# Patient Record
Sex: Female | Born: 1990 | Race: Asian | Hispanic: No | Smoking: Former smoker
Health system: Southern US, Community
[De-identification: ages and names within clinical notes are randomized; demographics above are authoritative.]

## PROBLEM LIST (undated history)

## (undated) ENCOUNTER — Inpatient Hospital Stay (HOSPITAL_COMMUNITY): Payer: Self-pay

## (undated) DIAGNOSIS — R002 Palpitations: Secondary | ICD-10-CM

## (undated) DIAGNOSIS — I10 Essential (primary) hypertension: Secondary | ICD-10-CM

## (undated) HISTORY — PX: NO PAST SURGERIES: SHX2092

## (undated) HISTORY — DX: Palpitations: R00.2

---

## 2012-08-24 ENCOUNTER — Inpatient Hospital Stay (HOSPITAL_COMMUNITY): Payer: Medicaid Other

## 2012-08-24 ENCOUNTER — Inpatient Hospital Stay (HOSPITAL_COMMUNITY)
Admission: AD | Admit: 2012-08-24 | Discharge: 2012-08-24 | Disposition: A | Discharge status: Home / Self Care | Payer: Medicaid Other | Source: Ambulatory Visit | Attending: Obstetrics & Gynecology | Admitting: Obstetrics & Gynecology

## 2012-08-24 ENCOUNTER — Encounter (HOSPITAL_COMMUNITY): Payer: Self-pay | Admitting: Family

## 2012-08-24 DIAGNOSIS — O418X90 Other specified disorders of amniotic fluid and membranes, unspecified trimester, not applicable or unspecified: Secondary | ICD-10-CM

## 2012-08-24 DIAGNOSIS — O10019 Pre-existing essential hypertension complicating pregnancy, unspecified trimester: Secondary | ICD-10-CM | POA: Insufficient documentation

## 2012-08-24 DIAGNOSIS — O209 Hemorrhage in early pregnancy, unspecified: Secondary | ICD-10-CM | POA: Insufficient documentation

## 2012-08-24 HISTORY — DX: Essential (primary) hypertension: I10

## 2012-08-24 LAB — WET PREP, GENITAL: Yeast Wet Prep HPF POC: NONE SEEN

## 2012-08-24 LAB — URINALYSIS, ROUTINE W REFLEX MICROSCOPIC
Bilirubin Urine: NEGATIVE
Glucose, UA: NEGATIVE mg/dL
Protein, ur: NEGATIVE mg/dL
Urobilinogen, UA: 0.2 mg/dL (ref 0.0–1.0)

## 2012-08-24 LAB — URINE MICROSCOPIC-ADD ON

## 2012-08-24 NOTE — MAU Provider Note (Signed)
History     CSN: [id number]  Arrival date and time: 08/24/12 7829   First Provider Initiated Contact with Patient 08/24/12 2050      Chief Complaint  Patient presents with  . Vaginal Bleeding   HPI Veronica Griffin 21 y.o. [redacted]w[redacted]d Comes to MAU today with vaginal bleeding.  Has not started prenatal care.  Has HTN and was started on medication by Urgent Care recently.  Is not consistent with taking medication.  OB History    Grav Para Term Preterm Abortions TAB SAB Ect Mult Living   2    1           Past Medical History  Diagnosis Date  . Hypertension     History reviewed. No pertinent past surgical history.  History reviewed. No pertinent family history.  History  Substance Use Topics  . Smoking status: Never Smoker   . Smokeless tobacco: Not on file  . Alcohol Use: No    Allergies: No Known Allergies  Prescriptions prior to admission  Medication Sig Dispense Refill  . methyldopa (ALDOMET) 250 MG tablet Take 250 mg by mouth 3 (three) times daily.      . Prenatal Vit-Fe Fumarate-FA (PRENATAL MULTIVITAMIN) TABS Take 1 tablet by mouth daily.        ROS Physical Exam   Blood pressure 132/93, pulse 81, temperature 97.8 F (36.6 C), temperature source Oral, resp. rate 16, height 5\' 6"  (1.676 m), weight 68.765 kg (151 lb 9.6 oz), last menstrual period 06/05/2012.  Physical Exam  Nursing note and vitals reviewed. Constitutional: She is oriented to person, place, and time. She appears well-developed and well-nourished.  HENT:  Head: Normocephalic.  Eyes: EOM are normal.  Neck: Neck supple.  GI: Soft. There is no tenderness.  Genitourinary:       Speculum exam: Vagina - Mod amount of blood seen, no odor Cervix - No contact bleeding Bimanual exam: Cervix closed Uterus non tender, 8 week size Adnexa non tender, no masses bilaterally GC/Chlam, wet prep done Chaperone present for exam.  Musculoskeletal: Normal range of motion.  Neurological: She is alert and  oriented to person, place, and time.  Skin: Skin is warm and dry.  Psychiatric: She has a normal mood and affect.    MAU Course  Procedures Results for orders placed during the hospital encounter of 08/24/12 (from the past 24 hour(s))  URINALYSIS, ROUTINE W REFLEX MICROSCOPIC     Status: Abnormal   Collection Time   08/24/12  8:12 PM      Component Value Range   Color, Urine YELLOW  YELLOW   APPearance CLEAR  CLEAR   Specific Gravity, Urine 1.020  1.005 - 1.030   pH 7.0  5.0 - 8.0   Glucose, UA NEGATIVE  NEGATIVE mg/dL   Hgb urine dipstick MODERATE (*) NEGATIVE   Bilirubin Urine NEGATIVE  NEGATIVE   Ketones, ur NEGATIVE  NEGATIVE mg/dL   Protein, ur NEGATIVE  NEGATIVE mg/dL   Urobilinogen, UA 0.2  0.0 - 1.0 mg/dL   Nitrite NEGATIVE  NEGATIVE   Leukocytes, UA NEGATIVE  NEGATIVE  URINE MICROSCOPIC-ADD ON     Status: Normal   Collection Time   08/24/12  8:12 PM      Component Value Range   Squamous Epithelial / LPF RARE  RARE   WBC, UA 0-2  <3 WBC/hpf   RBC / HPF 0-2  <3 RBC/hpf   Urine-Other MUCOUS PRESENT    POCT PREGNANCY, URINE  Status: Abnormal   Collection Time   08/24/12  8:21 PM      Component Value Range   Preg Test, Ur POSITIVE (*) NEGATIVE  WET PREP, GENITAL     Status: Abnormal   Collection Time   08/24/12  9:16 PM      Component Value Range   Yeast Wet Prep HPF POC NONE SEEN  NONE SEEN   Trich, Wet Prep NONE SEEN  NONE SEEN   Clue Cells Wet Prep HPF POC MODERATE (*) NONE SEEN   WBC, Wet Prep HPF POC FEW (*) NONE SEEN    MDM Clinical Data: Unsure of LMP. Bleeding.  OBSTETRIC <14 WK [place] AND TRANSVAGINAL OB US  Technique: Both transabdominal and transvaginal ultrasound  examinations were performed for complete evaluation of the  gestation as well as the maternal uterus, adnexal regions, and  pelvic cul-de-sac. Transvaginal technique was performed to assess  early pregnancy.  Comparison: None.  Intrauterine gestational sac: A single intrauterine  gestational  sac is present. A large subchorionic hemorrhage is visualized  Yolk sac: Yolk sac is present.  Embryo: Fetal pole is demonstrated.  Cardiac Activity: Fetal cardiac activity is visualized.  Heart Rate: 131 bpm  CRL: 10 mm 7 w 1 d [place] EDC: 04/11/2013  Maternal uterus/adnexae:  Images of the uterus suggesting arcuate configuration. The left  ovary measures 3.2 x 2.1 x 2.2 cm. No abnormal adnexal masses.  The right ovary measures 2.7 x 3.7 x 3.2 cm and contains a complex  cystic structure, likely a corpus luteum cyst. Normal follicular  changes. No abnormal adnexal masses. No free pelvic fluid  collections.  IMPRESSION:  Single intrauterine pregnancy. Estimated gestational age by crown-  rump length is 7 weeks 1 day. Large subchorionic hemorrhage is  visualized. Uterus suggesting arcuate configuration.   Blood type B positive   Assessment and Plan  7w 1d pregnancy with large subchorionic hemorrhage  Plan Pelvic rest until you have been seen in prenatal care - no sex, no tampons, no douching Continue blood pressure medication Begin prenatal care as soon as possible Take Tylenol 325 mg 2 tablets by mouth every 4 hours if needed for pain. Drink at least 8 8-oz glasses of water every day.    BURLESON,TERRI 08/24/2012, 11:34 PM

## 2012-08-24 NOTE — MAU Note (Signed)
Pt reports at 1930 felt feeling of starting period and went to BR; had blood in underwear and passed clot. Denies cramping.

## 2012-08-24 NOTE — MAU Note (Signed)
Pt LMP 06/05/2012, +UPT at [facility].   Bleeding today like a period, now very light.  Denies pain.

## 2012-08-25 ENCOUNTER — Emergency Department (HOSPITAL_BASED_OUTPATIENT_CLINIC_OR_DEPARTMENT_OTHER)
Admission: EM | Admit: 2012-08-25 | Discharge: 2012-08-25 | Disposition: A | Discharge status: Home / Self Care | Payer: Medicaid Other

## 2012-08-26 LAB — GC/CHLAMYDIA PROBE AMP, GENITAL: Chlamydia, DNA Probe: NEGATIVE

## 2012-09-16 ENCOUNTER — Ambulatory Visit (INDEPENDENT_AMBULATORY_CARE_PROVIDER_SITE_OTHER): Payer: Medicaid Other | Admitting: Obstetrics and Gynecology

## 2012-09-16 ENCOUNTER — Other Ambulatory Visit: Payer: Self-pay | Admitting: Obstetrics and Gynecology

## 2012-09-16 ENCOUNTER — Other Ambulatory Visit: Payer: Self-pay

## 2012-09-16 ENCOUNTER — Encounter: Payer: Self-pay | Admitting: Obstetrics and Gynecology

## 2012-09-16 ENCOUNTER — Encounter (INDEPENDENT_AMBULATORY_CARE_PROVIDER_SITE_OTHER): Payer: Medicaid Other

## 2012-09-16 VITALS — BP 124/80 | Ht 66.0 in | Wt 147.0 lb

## 2012-09-16 DIAGNOSIS — Z331 Pregnant state, incidental: Secondary | ICD-10-CM

## 2012-09-16 DIAGNOSIS — O200 Threatened abortion: Secondary | ICD-10-CM

## 2012-09-16 DIAGNOSIS — O26849 Uterine size-date discrepancy, unspecified trimester: Secondary | ICD-10-CM

## 2012-09-16 NOTE — Progress Notes (Unsigned)
Pt presented for NOB interview. Had U/s 07/24/12 for bleeding which showed lg subchoroinic hemorrhage.  No bleeding or pain until  09/15/12 when started bleeding enough to use a panty liner and cramping.  NO recent IC.  Per Dr AVS,interview cancelled.  Scheduled for U/S and F/U today.  Pt agreeable. Pt also has hx hypertension and is taking Aldomet 250 mg tid.

## 2012-09-16 NOTE — Progress Notes (Signed)
HISTORY OF PRESENT ILLNESS  Ms. Veronica Griffin is a 21 y.o. year old female,G2P0010, who presents for a problem visit. The patient had an ultrasound and the [facility] on August 24, 2012 because of vaginal bleeding.  The ultrasound showed a viable gestation at 7 weeks and 1 day.  A subchorionic hemorrhage was noted.  Subjective:  The patient has pain and has had bleeding in the past.  Objective:  BP 124/80  Ht 5\' 6"  (1.676 m)  Wt 147 lb (66.679 kg)  BMI 23.73 kg/m2  LMP 06/05/2012   General: no distress GI: soft and nontender  Exam deferred.  Ultrasound: Single gestation at 10 weeks and 3 days consistent with prior ultrasound.  Fetal heart rate 159 bpm.  Subchorionic hemorrhage noted.  Assessment:  Threatened abortion  Plan:  Return for new OB exam and interview.  Patient's blood type is B+.  Return to office prn if symptoms worsen or fail to improve.   Leonard Schwartz M.D.  09/16/2012 3:13 PM

## 2012-09-23 ENCOUNTER — Ambulatory Visit (INDEPENDENT_AMBULATORY_CARE_PROVIDER_SITE_OTHER): Payer: Medicaid Other | Admitting: Obstetrics and Gynecology

## 2012-09-23 DIAGNOSIS — Z331 Pregnant state, incidental: Secondary | ICD-10-CM

## 2012-09-23 LAB — US OB COMP LESS 14 WKS

## 2012-09-24 LAB — PRENATAL PANEL VII
Eosinophils Absolute: 0 10*3/uL (ref 0.0–0.7)
Hemoglobin: 12 g/dL (ref 12.0–15.0)
Hepatitis B Surface Ag: NEGATIVE
Lymphocytes Relative: 25 % (ref 12–46)
Lymphs Abs: 1.7 10*3/uL (ref 0.7–4.0)
MCH: 25.1 pg — ABNORMAL LOW (ref 26.0–34.0)
MCV: 73.8 fL — ABNORMAL LOW (ref 78.0–100.0)
Monocytes Relative: 6 % (ref 3–12)
Neutrophils Relative %: 68 % (ref 43–77)
Platelets: 326 10*3/uL (ref 150–400)
RBC: 4.78 MIL/uL (ref 3.87–5.11)
Rubella: 1.04 Index — ABNORMAL HIGH (ref <0.90)
WBC: 6.8 10*3/uL (ref 4.0–10.5)

## 2012-09-24 LAB — POCT URINALYSIS DIPSTICK
Leukocytes, UA: NEGATIVE
Nitrite, UA: NEGATIVE
Protein, UA: NEGATIVE
Urobilinogen, UA: NEGATIVE
pH, UA: 6

## 2012-09-24 LAB — CULTURE, OB URINE
Colony Count: NO GROWTH
Organism ID, Bacteria: NO GROWTH

## 2012-09-24 NOTE — Progress Notes (Signed)
NOB interview completed.  Pt's last interview was stopped d/t vaginal bldg, was followed up w/ [place] and visit at that time.  Pt denies any bldg since that time.  NOB work up scheduled on Thursday 10/03/2012 w/ AVS.

## 2012-09-26 LAB — HEMOGLOBINOPATHY EVALUATION
Hemoglobin Other: 23.4 % — ABNORMAL HIGH
Hgb A: 73.4 % — ABNORMAL LOW (ref 96.8–97.8)
Hgb S Quant: 0 %

## 2012-10-01 LAB — HGB ELECTROPHORESIS REFLEXED REPORT
Hemoglobin A - HGBRFX: 72.9 % — ABNORMAL LOW (ref >96.0)
Hemoglobin E: 23.8 % — ABNORMAL HIGH

## 2012-10-02 NOTE — L&D Delivery Note (Signed)
  [place], [place] [[id number]]  Delivery Note At  a viable female, "Skylar",  was delivered via  (Presentation ROA: ;  ).  APGAR: , ; weight 4+6 Placenta status: Spontaneous and intact, .  Cord: WNL .  Cord pH: NA Placenta to pathology due to severe pre-eclampsia. Patient progressed rapidly to delivery.  SROM--clear fluid, just prior to delivery.  NICU team called to assess infant due to low birth weight.  Anesthesia:  Local Episiotomy: None Lacerations: 1st degree perineal Suture Repair: 3.0 vicryl Est. Blood Loss (mL): 300 cc  Mom to AICU for 24 hours of magnesium sulfate.  Apresoline IV prn elevated BP.  Continue Labetalol 300 mg po TID.  Baby to NICU.  Nigel Bridgeman 03/12/2013, 3:41 AM

## 2012-10-03 ENCOUNTER — Encounter: Payer: Medicaid Other | Admitting: Obstetrics and Gynecology

## 2012-10-08 DIAGNOSIS — O4690 Antepartum hemorrhage, unspecified, unspecified trimester: Secondary | ICD-10-CM

## 2012-10-08 HISTORY — DX: Antepartum hemorrhage, unspecified, unspecified trimester: O46.90

## 2012-10-10 ENCOUNTER — Ambulatory Visit (INDEPENDENT_AMBULATORY_CARE_PROVIDER_SITE_OTHER): Payer: Medicaid Other | Admitting: Obstetrics and Gynecology

## 2012-10-10 VITALS — BP 120/80 | Wt 147.0 lb

## 2012-10-10 DIAGNOSIS — B9689 Other specified bacterial agents as the cause of diseases classified elsewhere: Secondary | ICD-10-CM

## 2012-10-10 DIAGNOSIS — O10919 Unspecified pre-existing hypertension complicating pregnancy, unspecified trimester: Secondary | ICD-10-CM

## 2012-10-10 DIAGNOSIS — N760 Acute vaginitis: Secondary | ICD-10-CM

## 2012-10-10 DIAGNOSIS — Z3689 Encounter for other specified antenatal screening: Secondary | ICD-10-CM

## 2012-10-10 DIAGNOSIS — O10019 Pre-existing essential hypertension complicating pregnancy, unspecified trimester: Secondary | ICD-10-CM

## 2012-10-10 DIAGNOSIS — A499 Bacterial infection, unspecified: Secondary | ICD-10-CM

## 2012-10-10 DIAGNOSIS — O4690 Antepartum hemorrhage, unspecified, unspecified trimester: Secondary | ICD-10-CM

## 2012-10-10 HISTORY — DX: Unspecified pre-existing hypertension complicating pregnancy, unspecified trimester: O10.919

## 2012-10-10 MED ORDER — SIMILAC PRENATAL EARLY SHIELD 27-0.8 & 200 MG PO MISC: 1.0000 | Freq: Every day | ORAL | Status: DC

## 2012-10-10 MED ORDER — METRONIDAZOLE 500 MG PO TABS: 500.0000 mg | ORAL_TABLET | Freq: Two times a day (BID) | ORAL | Status: AC

## 2012-10-10 NOTE — Progress Notes (Signed)
Pt is here today for her NOB work-up. Pt had cultures done 08/24/2012 wnl, never had a pap. Pt stated no issues today.

## 2012-10-10 NOTE — Progress Notes (Signed)
Veronica Griffin is being seen today for her first obstetrical visit at [redacted]w[redacted]d gestation by 1st trim [place].  Seen MAU for vaginal bleeding, large SCH seen, no further bleeding seen since.  She reports upper back pain likely due to changes in posture.  Her obstetrical history is significant for: Patient Active Problem List  Diagnosis  . Vaginal bleeding in pregnancy  . Chronic hypertension in pregnancy  HTN: Diagnosed in Highschool, given Aldomet at Urgent care visit when pregnancy was diagnosed. Currently on 250mg  TID.  Relationship with [name]:  Veronica Griffin present with her today  She is employed  Feeding plan:   Breast  Pregnancy history fully reviewed.  The following portions of the patient's history were reviewed and updated as appropriate: allergies, current medications, past family history, past medical history, past social history, past surgical history and problem list.  Review of Systems Pertinent ROS is described in HPI   Objective:   BP 120/80  Wt 147 lb (66.679 kg)  LMP 06/05/2012 Wt Readings from Last 1 Encounters:  10/10/12 147 lb (66.679 kg)   BMI: There is no height on file to calculate BMI.  General: alert, cooperative and no distress HEENT: grossly normal  Thyroid: normal  Respiratory: clear to auscultation bilaterally Cardiovascular: regular rate and rhythm,  Breasts:  No dominant masses, nipples erect Gastrointestinal: soft, non-tender; no masses,  no organomegaly Extremities: extremities normal, no pain or edema Vaginal Bleeding: None Back: no CVAT EXTERNAL GENITALIA: normal appearing vulva with no masses, tenderness or lesions VAGINA: Mod amount frothy white dc  CERVIX: no lesions or cervical motion tenderness; cervix closed, long, firm UTERUS: gravid and consistent with 13-14 weeks ADNEXA: no masses palpable and nontender OB EXAM PELVIMETRY: appears adequate  FHR:  140  bpm  Assessment:    Pregnancy at [redacted]w[redacted]d  BV Chronic HTN--on  Aldomet  Plan:     Prenatal panel reviewed and discussed with the patient:  yes  Pap smear collected:  yes GC/Chlamydia collected:  yes Wet prep:  +BV Discussion of Genetic testing options: plans Quad screen at [facility] Prenatal vitamins recommended  Rx MTZ 500 mg BID x7 days Similac PNV  Plan of care: Next visit:  4 weeks for ROB, [place], 24h Urine, PIH labs, Quad  Other anticipated f/u:      Veronica Griffin, CNM Veronica Griffin CNM

## 2012-10-12 LAB — PAP IG, CT-NG, RFX HPV ASCU: Chlamydia Probe Amp: NEGATIVE

## 2012-10-14 ENCOUNTER — Encounter: Payer: Medicaid Other | Admitting: Obstetrics and Gynecology

## 2012-10-15 ENCOUNTER — Telehealth: Payer: Self-pay | Admitting: Obstetrics and Gynecology

## 2012-10-15 ENCOUNTER — Other Ambulatory Visit: Payer: Self-pay

## 2012-10-15 DIAGNOSIS — O10919 Unspecified pre-existing hypertension complicating pregnancy, unspecified trimester: Secondary | ICD-10-CM

## 2012-10-15 NOTE — Telephone Encounter (Signed)
Tc to pt per telephone call. Pt with appt on 11/07/12 and states,"only able to do 24 urine on Saturdays due to work schedule". Informed pt may perform 24 urine on 11/02/12 and turn it in on 11/04/12(ok per [facility]). Pt will also have PIH labs drawn with 24 urine drop off. Apps sched 11/04/12 @ 9:00a with lab only. Pt voices understanding.

## 2012-11-04 ENCOUNTER — Other Ambulatory Visit: Payer: Medicaid Other

## 2012-11-04 DIAGNOSIS — O10919 Unspecified pre-existing hypertension complicating pregnancy, unspecified trimester: Secondary | ICD-10-CM

## 2012-11-04 LAB — COMPLETE METABOLIC PANEL WITH GFR
ALT: 16 U/L (ref 0–35)
CO2: 24 mEq/L (ref 19–32)
Calcium: 9.4 mg/dL (ref 8.4–10.5)
Chloride: 103 mEq/L (ref 96–112)
Creat: 0.64 mg/dL (ref 0.50–1.10)
GFR, Est African American: >89 mL/min
GFR, Est Non African American: >89 mL/min
Glucose, Bld: 85 mg/dL (ref 70–99)
Sodium: 137 mEq/L (ref 135–145)
Total Protein: 6.8 g/dL (ref 6.0–8.3)

## 2012-11-04 LAB — CBC
Hemoglobin: 11 g/dL — ABNORMAL LOW (ref 12.0–15.0)
MCV: 73.6 fL — ABNORMAL LOW (ref 78.0–100.0)
Platelets: 296 10*3/uL (ref 150–400)
RBC: 4.24 MIL/uL (ref 3.87–5.11)
WBC: 7.6 10*3/uL (ref 4.0–10.5)

## 2012-11-04 LAB — CREATININE, URINE, 24 HOUR: Creatinine, 24H Ur: 1391 mg/d (ref 700–1800)

## 2012-11-07 ENCOUNTER — Encounter: Payer: Medicaid Other | Admitting: Obstetrics and Gynecology

## 2012-11-07 ENCOUNTER — Encounter: Payer: Self-pay | Admitting: Obstetrics and Gynecology

## 2012-11-07 ENCOUNTER — Ambulatory Visit: Payer: Medicaid Other

## 2012-11-07 ENCOUNTER — Ambulatory Visit: Payer: Medicaid Other | Admitting: Obstetrics and Gynecology

## 2012-11-07 VITALS — BP 110/70 | Wt 150.0 lb

## 2012-11-07 DIAGNOSIS — Z3689 Encounter for other specified antenatal screening: Secondary | ICD-10-CM

## 2012-11-07 DIAGNOSIS — Z331 Pregnant state, incidental: Secondary | ICD-10-CM

## 2012-11-07 DIAGNOSIS — Z1389 Encounter for screening for other disorder: Secondary | ICD-10-CM

## 2012-11-07 NOTE — Progress Notes (Signed)
[redacted]w[redacted]d Pt has no complaints  Pt declines flu vaccine

## 2012-11-07 NOTE — Progress Notes (Signed)
Ultrasound shows:  SIUP  S=D     [place] EDD: 04/14/13           EFW: 7 oz 54%           AFI: n/a           Cervical length: 3.24 cm           Placenta localization: posterior           Fetal presentation: breech                    Anatomy survey is normal           Gender : female Comments: placenta edge to cx os = 4.6 Normal placental cord insertion Measurements are concordant with established GA/EDD No fetal abnormality seen Anatomy not seen: 4 ch heart, RVOT, LVOT, DA Suggest f/u in 2-3 weeks

## 2012-11-07 NOTE — Progress Notes (Signed)
[redacted]w[redacted]d CHTN on Aldomet 250 mg TID Ultrasound at [facility] for heart anatomy

## 2012-11-12 LAB — US OB COMP + 14 WK

## 2012-11-15 ENCOUNTER — Telehealth: Payer: Self-pay | Admitting: Obstetrics and Gynecology

## 2012-11-15 NOTE — Telephone Encounter (Signed)
The patient called our office. She C/O nasal congestion. Management reviewed including fluids, rest, Tylenol, and Sudafed.  Dr. Stefano Gaul

## 2012-12-03 ENCOUNTER — Ambulatory Visit: Payer: Medicaid Other

## 2012-12-03 ENCOUNTER — Ambulatory Visit: Payer: Medicaid Other | Admitting: Obstetrics and Gynecology

## 2012-12-03 ENCOUNTER — Encounter: Payer: Self-pay | Admitting: Obstetrics and Gynecology

## 2012-12-03 VITALS — BP 114/78 | Wt 151.0 lb

## 2012-12-03 DIAGNOSIS — Z331 Pregnant state, incidental: Secondary | ICD-10-CM

## 2012-12-03 DIAGNOSIS — Z3689 Encounter for other specified antenatal screening: Secondary | ICD-10-CM

## 2012-12-03 DIAGNOSIS — O10912 Unspecified pre-existing hypertension complicating pregnancy, second trimester: Secondary | ICD-10-CM

## 2012-12-03 DIAGNOSIS — O358XX0 Maternal care for other (suspected) fetal abnormality and damage, not applicable or unspecified: Secondary | ICD-10-CM

## 2012-12-03 NOTE — Progress Notes (Signed)
[redacted]w[redacted]d [place] f/u anatomy cx 3.44 cm SIUP vtx Normal fluid All anatomy seen and normal QUAD screen today CHTN BP well controlled on aldomet 24 hr urine and labs WNL RT 2 weeks

## 2012-12-03 NOTE — Progress Notes (Signed)
Pt c/o lower abdominal pressure 

## 2012-12-04 LAB — AFP, QUAD SCREEN
Age Alone: 1:1160 {titer}
Curr Gest Age: 21.4 wks.days
Down Syndrome Scr Risk Est: <1:38500 {titer}
INH: 96.2 pg/mL
MoM for INH: 0.44
MoM for hCG: 0.44
Osb Risk: <1:27300 {titer}
Tri 18 Scr Risk Est: NEGATIVE
Trisomy 18 (Edward) Syndrome Interp.: 1:9330 {titer}
uE3 Value: 1.8 ng/mL

## 2012-12-10 LAB — US OB FOLLOW UP

## 2012-12-20 ENCOUNTER — Ambulatory Visit: Payer: Medicaid Other | Admitting: Obstetrics and Gynecology

## 2012-12-20 ENCOUNTER — Encounter: Payer: Self-pay | Admitting: Obstetrics and Gynecology

## 2012-12-20 VITALS — BP 106/70 | Wt 155.0 lb

## 2012-12-20 DIAGNOSIS — O10912 Unspecified pre-existing hypertension complicating pregnancy, second trimester: Secondary | ICD-10-CM

## 2012-12-20 DIAGNOSIS — Z331 Pregnant state, incidental: Secondary | ICD-10-CM

## 2012-12-20 NOTE — Progress Notes (Signed)
[redacted]w[redacted]d GFM Aldomet 250 mg TID: pt advised to reduce to BID C/O frequent heart palpitations which triggers cough without  Dizziness or chest pain. Auscultation with PVC and pause. Will refer to cardiology

## 2012-12-20 NOTE — Progress Notes (Signed)
[redacted]w[redacted]d Pt has no complaints

## 2012-12-26 ENCOUNTER — Other Ambulatory Visit (HOSPITAL_COMMUNITY): Payer: Self-pay | Admitting: Internal Medicine

## 2012-12-26 DIAGNOSIS — R0600 Dyspnea, unspecified: Secondary | ICD-10-CM

## 2012-12-26 DIAGNOSIS — R002 Palpitations: Secondary | ICD-10-CM

## 2012-12-31 ENCOUNTER — Ambulatory Visit (HOSPITAL_COMMUNITY)
Admission: RE | Admit: 2012-12-31 | Discharge: 2012-12-31 | Disposition: A | Discharge status: Home / Self Care | Payer: Medicaid Other | Source: Ambulatory Visit | Attending: Internal Medicine | Admitting: Internal Medicine

## 2012-12-31 DIAGNOSIS — I079 Rheumatic tricuspid valve disease, unspecified: Secondary | ICD-10-CM | POA: Insufficient documentation

## 2012-12-31 DIAGNOSIS — O99419 Diseases of the circulatory system complicating pregnancy, unspecified trimester: Secondary | ICD-10-CM | POA: Insufficient documentation

## 2012-12-31 DIAGNOSIS — R0600 Dyspnea, unspecified: Secondary | ICD-10-CM

## 2012-12-31 DIAGNOSIS — O169 Unspecified maternal hypertension, unspecified trimester: Secondary | ICD-10-CM | POA: Insufficient documentation

## 2012-12-31 DIAGNOSIS — O99891 Other specified diseases and conditions complicating pregnancy: Secondary | ICD-10-CM | POA: Insufficient documentation

## 2012-12-31 DIAGNOSIS — R002 Palpitations: Secondary | ICD-10-CM

## 2012-12-31 DIAGNOSIS — I379 Nonrheumatic pulmonary valve disorder, unspecified: Secondary | ICD-10-CM | POA: Insufficient documentation

## 2012-12-31 DIAGNOSIS — I2510 Atherosclerotic heart disease of native coronary artery without angina pectoris: Secondary | ICD-10-CM | POA: Insufficient documentation

## 2012-12-31 NOTE — Progress Notes (Signed)
2D Echo Performed 12/31/2012    Clearence Ped, RCS

## 2013-01-13 ENCOUNTER — Encounter (HOSPITAL_COMMUNITY): Payer: Self-pay

## 2013-01-13 ENCOUNTER — Inpatient Hospital Stay (HOSPITAL_COMMUNITY)
Admission: AD | Admit: 2013-01-13 | Discharge: 2013-01-13 | Disposition: A | Discharge status: Home / Self Care | Payer: Medicaid Other | Source: Ambulatory Visit | Attending: Obstetrics and Gynecology | Admitting: Obstetrics and Gynecology

## 2013-01-13 DIAGNOSIS — O99891 Other specified diseases and conditions complicating pregnancy: Secondary | ICD-10-CM | POA: Insufficient documentation

## 2013-01-13 DIAGNOSIS — R109 Unspecified abdominal pain: Secondary | ICD-10-CM | POA: Insufficient documentation

## 2013-01-13 DIAGNOSIS — O10919 Unspecified pre-existing hypertension complicating pregnancy, unspecified trimester: Secondary | ICD-10-CM

## 2013-01-13 LAB — URINALYSIS, ROUTINE W REFLEX MICROSCOPIC
Bilirubin Urine: NEGATIVE
Glucose, UA: NEGATIVE mg/dL
Protein, ur: NEGATIVE mg/dL
Specific Gravity, Urine: 1.025 (ref 1.005–1.030)
Urobilinogen, UA: 0.2 mg/dL (ref 0.0–1.0)

## 2013-01-13 LAB — CBC WITH DIFFERENTIAL/PLATELET
Eosinophils Relative: 1 % (ref 0–5)
Lymphocytes Relative: 12 % (ref 12–46)
Monocytes Relative: 6 % (ref 3–12)
Neutrophils Relative %: 81 % — ABNORMAL HIGH (ref 43–77)
Platelets: 274 10*3/uL (ref 150–400)
RBC: 4.2 MIL/uL (ref 3.87–5.11)
WBC: 14.4 10*3/uL — ABNORMAL HIGH (ref 4.0–10.5)

## 2013-01-13 LAB — URINE MICROSCOPIC-ADD ON

## 2013-01-13 LAB — COMPREHENSIVE METABOLIC PANEL
ALT: 22 U/L (ref 0–35)
AST: 22 U/L (ref 0–37)
CO2: 20 mEq/L (ref 19–32)
Chloride: 103 mEq/L (ref 96–112)
GFR calc non Af Amer: 88 mL/min — ABNORMAL LOW (ref >90)
Sodium: 136 mEq/L (ref 135–145)
Total Bilirubin: 0.2 mg/dL — ABNORMAL LOW (ref 0.3–1.2)

## 2013-01-13 MED ORDER — DEXTROSE 5 % IV SOLN
2.0000 g | Freq: Once | INTRAVENOUS | Status: AC
  Administered 2013-01-13: 2 g via INTRAVENOUS
  Filled 2013-01-13: qty 2

## 2013-01-13 MED ORDER — PROMETHAZINE HCL 25 MG/ML IJ SOLN
25.0000 mg | Freq: Once | INTRAMUSCULAR | Status: AC
  Administered 2013-01-13: 25 mg via INTRAVENOUS
  Filled 2013-01-13: qty 1

## 2013-01-13 MED ORDER — BUTORPHANOL TARTRATE 1 MG/ML IJ SOLN
1.0000 mg | Freq: Once | INTRAMUSCULAR | Status: AC
  Administered 2013-01-13: 1 mg via INTRAVENOUS
  Filled 2013-01-13: qty 1

## 2013-01-13 MED ORDER — LACTATED RINGERS IV SOLN
INTRAVENOUS | Status: DC
  Administered 2013-01-13: 09:00:00 via INTRAVENOUS

## 2013-01-13 MED ORDER — IBUPROFEN 800 MG PO TABS
800.0000 mg | ORAL_TABLET | Freq: Once | ORAL | Status: AC
  Administered 2013-01-13: 800 mg via ORAL
  Filled 2013-01-13: qty 1

## 2013-01-13 MED ORDER — CEPHALEXIN 500 MG PO CAPS: 500.0000 mg | ORAL_CAPSULE | Freq: Four times a day (QID) | ORAL | Status: AC

## 2013-01-13 MED ORDER — IBUPROFEN 800 MG PO TABS: 800.0000 mg | ORAL_TABLET | Freq: Three times a day (TID) | ORAL | Status: DC | PRN

## 2013-01-13 MED ORDER — ACETAMINOPHEN-CODEINE #3 300-30 MG PO TABS: 1.0000 | ORAL_TABLET | ORAL | Status: DC | PRN

## 2013-01-13 MED ORDER — LACTATED RINGERS IV BOLUS (SEPSIS)
500.0000 mL | Freq: Once | INTRAVENOUS | Status: AC
  Administered 2013-01-13: 07:00:00 via INTRAVENOUS

## 2013-01-13 NOTE — MAU Provider Note (Signed)
History     CSN: [id number]  Arrival date and time: 01/13/13 0516   None     Chief Complaint  Patient presents with  . Abdominal Pain   HPI Comments: Pt is a G2P0 at [redacted]w[redacted]d w sudden onset of vague L sided/flank pain at about 9pm this evening, pt had called overnight and instructed to PO hydrate, take 600mg  motrin, hot bath/shower or heating pad, and return call if pain worsens or no relief, pt returned TC at about 430 this morning w report of no pain relief and instructed to come to MAU. She denies any ctx, VB, LOF, or discharge, reports GFM. C/o urinary urgency, in the last day, but no frequency, or dysuria. Denies any fever, aches, chills. Pain does not radiate.  Pt has a history of hypertension prior to pregnancy, has been on aldomet TID, recently had cardiac w/u. Otherwise has had an uncomplicated pregnancy, next appt this week.      Past Medical History  Diagnosis Date  . Hypertension     Taking Aldomet 250mg  TID    Past Surgical History  Procedure Laterality Date  . No past surgeries      Family History  Problem Relation Age of Onset  . Hypertension Mother   . Hypertension Maternal Aunt     History  Substance Use Topics  . Smoking status: Former Smoker    Types: Cigarettes    Quit date: 12/01/2011  . Smokeless tobacco: Never Used  . Alcohol Use: 1.0 oz/week    2 drink(s) per week     Comment: Occasionally before +UPT    Allergies: No Known Allergies  Prescriptions prior to admission  Medication Sig Dispense Refill  . methyldopa (ALDOMET) 250 MG tablet Take 250 mg by mouth 3 (three) times daily.      . Prenatal MV-Min-Fe Fum-FA-DHA [employer] PRENATAL EARLY SHIELD) 27-0.8 & 200 MG MISC Take 1 tablet by mouth daily.  30 each  11  . Prenatal Vit-Fe Fumarate-FA (PRENATAL MULTIVITAMIN) TABS Take 1 tablet by mouth daily.        Review of Systems  Constitutional: Negative for fever and chills.  Eyes: Negative for blurred vision.  Respiratory: Negative for  shortness of breath.   Cardiovascular: Negative for chest pain and leg swelling.  Gastrointestinal: Positive for nausea. Negative for heartburn, vomiting, abdominal pain, diarrhea and constipation.  Genitourinary: Positive for urgency and flank pain. Negative for dysuria, frequency and hematuria.  Musculoskeletal: Positive for back pain. Negative for myalgias.  Neurological: Negative for headaches.   Physical Exam   Blood pressure 132/94, pulse 98, temperature 98.2 F (36.8 C), temperature source Oral, resp. rate 16, height 5\' 6"  (1.676 m), weight 161 lb (73.029 kg), last menstrual period 06/05/2012, SpO2 100.00%.  Physical Exam  Nursing note and vitals reviewed. Constitutional: She is oriented to person, place, and time. She appears well-developed and well-nourished. She appears distressed.  Pt visibly uncomfortable, holds L side   HENT:  Head: Normocephalic.  Eyes: Pupils are equal, round, and reactive to light.  Neck: Normal range of motion.  Cardiovascular: Normal rate, regular rhythm and normal heart sounds.   Respiratory: Effort normal and breath sounds normal.  GI: Soft. Bowel sounds are normal. She exhibits no distension. There is no tenderness. There is no rebound and no guarding.  aga L CVAT   Genitourinary: Vagina normal.  Cervix FT/th/high, posterior soft  Musculoskeletal: Normal range of motion.  Neurological: She is alert and oriented to person, place, and time. She has  normal reflexes.  Skin: Skin is warm and dry.  Psychiatric: She has a normal mood and affect. Her behavior is normal.   NST reactive toco quiet  MAU Course  Procedures   Assessment and Plan  IUP at [redacted]w[redacted]d Suspect pyelonephritis or kidney stone  FHR reassuring  WBC elevated, diff pending cmet WNL UA +hgb  IVF's LR bolus Stadol/phenergan IV UA sent for cx Consider [place] to r/o kidney stone  Report to C.Steelman, CNM    LILLARD,SHELLEY M 01/13/2013, 6:49 AM   Addendum: Pt with some pain  relief s/p IV stadol.  No n/v while in MAU.  Works at [employer]. No pain except mid lt back region.  Pos CVAT on exam.  Accompanied by her sister.

## 2013-01-13 NOTE — MAU Note (Signed)
Pt reports left side pain for the last 7 hours, worsening. Denies bleeding or ROM. Reports dysuria

## 2013-02-20 ENCOUNTER — Other Ambulatory Visit: Payer: Self-pay

## 2013-02-22 ENCOUNTER — Inpatient Hospital Stay (HOSPITAL_COMMUNITY)
Admission: AD | Admit: 2013-02-22 | Discharge: 2013-02-22 | Disposition: A | Discharge status: Home / Self Care | Payer: Medicaid Other | Source: Ambulatory Visit | Attending: Obstetrics and Gynecology | Admitting: Obstetrics and Gynecology

## 2013-02-22 ENCOUNTER — Encounter (HOSPITAL_COMMUNITY): Payer: Self-pay | Admitting: Obstetrics and Gynecology

## 2013-02-22 DIAGNOSIS — O10019 Pre-existing essential hypertension complicating pregnancy, unspecified trimester: Secondary | ICD-10-CM | POA: Insufficient documentation

## 2013-02-22 DIAGNOSIS — O163 Unspecified maternal hypertension, third trimester: Secondary | ICD-10-CM

## 2013-02-22 LAB — PROTEIN, URINE, 24 HOUR
Collection Interval-UPROT: 24 hours
Protein, Urine: 24 mg/dL
Urine Total Volume-UPROT: 800 mL

## 2013-02-22 MED ORDER — METHYLDOPA 500 MG PO TABS: 500.0000 mg | ORAL_TABLET | Freq: Three times a day (TID) | ORAL | Status: DC

## 2013-02-22 MED ORDER — METHYLDOPA 250 MG PO TABS
250.0000 mg | ORAL_TABLET | Freq: Once | ORAL | Status: AC
  Administered 2013-02-22: 250 mg via ORAL
  Filled 2013-02-22: qty 1

## 2013-02-22 NOTE — MAU Provider Note (Signed)
History   CSN: [id number]  Arrival date and time: 02/22/13 4098   Chief Complaint  Patient presents with  . Hypertension   HPI Pt presents to MAU to drop off 24hr urine collection due to elevated blood pressure at [facility] office on Thurs, 02/20/13.  BP obtained in triage elevated at 135/109.  Serum PIH labs drawn at that time were WNL.  Pt with a hx of chronic hypertension for approximately the past 3-43yrs.  She currently is taking Aldomet 250mg  tid.  She denies missing or skipping any meds.  She currently denies any headache, vision chgs, RUQ pain or edema.  She reports her fetus is active and denies UCs, ROM or bldg.  She denies any other pregnancy complications.     OB History   Grav Para Term Preterm Abortions TAB SAB Ect Mult Living   2    1           Past Medical History  Diagnosis Date  . Hypertension     Taking Aldomet 250mg  TID    Past Surgical History  Procedure Laterality Date  . No past surgeries      Family History  Problem Relation Age of Onset  . Hypertension Mother   . Hypertension Maternal Aunt     History  Substance Use Topics  . Smoking status: Former Smoker    Types: Cigarettes    Quit date: 12/01/2011  . Smokeless tobacco: Never Used  . Alcohol Use: 1.0 oz/week    2 drink(s) per week     Comment: Occasionally before +UPT    Allergies: No Known Allergies  Prescriptions prior to admission  Medication Sig Dispense Refill  . methyldopa (ALDOMET) 250 MG tablet Take 250 mg by mouth 3 (three) times daily.      . Prenatal Vit-Fe Fumarate-FA (PRENATAL MULTIVITAMIN) TABS Take 1 tablet by mouth daily.        Review of Systems  Constitutional: Negative.   HENT: Negative.   Eyes: Negative.   Respiratory: Negative.   Cardiovascular: Negative.   Gastrointestinal: Negative.   Genitourinary: Negative.   Musculoskeletal: Negative.   Skin: Negative.   Neurological: Negative.   Endo/Heme/Allergies: Negative.   Psychiatric/Behavioral: Negative.     Physical Exam   Blood pressure 141/113, pulse 79, temperature 98.1 F (36.7 C), temperature source Oral, resp. rate 18, last menstrual period 06/05/2012. Filed Vitals:   02/22/13 1115 02/22/13 1130 02/22/13 1140 02/22/13 1142  BP: 130/100 128/105  133/100  Pulse: 78 79 79 78  Temp:      TempSrc:      Resp:    18   Physical Exam  Constitutional: She is oriented to person, place, and time. She appears well-developed and well-nourished.  HENT:  Head: Normocephalic and atraumatic.  Right Ear: External ear normal.  Left Ear: External ear normal.  Eyes: Conjunctivae are normal. Pupils are equal, round, and reactive to light.  Neck: Normal range of motion. Neck supple.  Cardiovascular: Normal rate, regular rhythm and intact distal pulses.   Respiratory: Effort normal and breath sounds normal.  GI: Soft. Bowel sounds are normal.  Gravid/Non tender  Genitourinary: Vagina normal and uterus normal.  Musculoskeletal: Normal range of motion.  Neurological: She is alert and oriented to person, place, and time. She has normal reflexes.  Skin: Skin is warm and dry.  Psychiatric: She has a normal mood and affect. Her behavior is normal. Judgment and thought content normal.    MAU Course  Procedures NST  Assessment and  Plan  IUP at 33w 1d Chronic hypertension with elevated blood pressures, rule out preeclampsia  Consult obtained with Dr. Stefano Gaul. Pt will increased Aldomet to 500mg  tid.  Additional 250mg  given to pt prior to discharge.  Rx 500mg  # 120, 1 refill given at discharge. Pt to call and come to office on Tuesday, 02/25/13 for BP check and visit. Revd's s/s preeclampsia as well as fetal kick counts and s/s preterm labor.  Geneva Pallas O. 02/22/2013, 11:33 AM

## 2013-02-22 NOTE — MAU Note (Signed)
Pt presents for blood pressure check and to turn in 24 hour urine. Blood pressure high in triage so pt taken to room for monitoing

## 2013-03-07 ENCOUNTER — Encounter (HOSPITAL_COMMUNITY): Payer: Self-pay | Admitting: Family

## 2013-03-07 ENCOUNTER — Inpatient Hospital Stay (HOSPITAL_COMMUNITY)
Admission: AD | Admit: 2013-03-07 | Discharge: 2013-03-14 | DRG: 774 | Disposition: A | Discharge status: Home / Self Care | Payer: Medicaid Other | Source: Ambulatory Visit | Attending: Obstetrics and Gynecology | Admitting: Obstetrics and Gynecology

## 2013-03-07 DIAGNOSIS — IMO0002 Reserved for concepts with insufficient information to code with codable children: Principal | ICD-10-CM | POA: Diagnosis present

## 2013-03-07 DIAGNOSIS — O159 Eclampsia, unspecified as to time period: Secondary | ICD-10-CM

## 2013-03-07 DIAGNOSIS — O119 Pre-existing hypertension with pre-eclampsia, unspecified trimester: Secondary | ICD-10-CM

## 2013-03-07 DIAGNOSIS — I10 Essential (primary) hypertension: Secondary | ICD-10-CM | POA: Diagnosis present

## 2013-03-07 DIAGNOSIS — D565 Hemoglobin E-beta thalassemia: Secondary | ICD-10-CM | POA: Diagnosis present

## 2013-03-07 HISTORY — DX: Eclampsia, unspecified as to time period: O15.9

## 2013-03-07 HISTORY — DX: Pre-existing hypertension with pre-eclampsia, unspecified trimester: O11.9

## 2013-03-07 MED ORDER — METHYLDOPA 500 MG PO TABS: 500.0000 mg | ORAL_TABLET | Freq: Three times a day (TID) | ORAL | Status: DC

## 2013-03-07 MED ORDER — ZOLPIDEM TARTRATE 5 MG PO TABS
5.0000 mg | ORAL_TABLET | Freq: Every evening | ORAL | Status: DC | PRN
  Administered 2013-03-08: 5 mg via ORAL
  Filled 2013-03-07: qty 1

## 2013-03-07 MED ORDER — METHYLDOPA 500 MG PO TABS
750.0000 mg | ORAL_TABLET | Freq: Three times a day (TID) | ORAL | Status: DC
  Administered 2013-03-08 – 2013-03-10 (×7): 750 mg via ORAL
  Filled 2013-03-07 (×8): qty 1

## 2013-03-07 MED ORDER — DOCUSATE SODIUM 100 MG PO CAPS
100.0000 mg | ORAL_CAPSULE | Freq: Every day | ORAL | Status: DC
  Administered 2013-03-08 – 2013-03-11 (×4): 100 mg via ORAL
  Filled 2013-03-07 (×6): qty 1

## 2013-03-07 MED ORDER — CALCIUM CARBONATE ANTACID 500 MG PO CHEW: 2.0000 | CHEWABLE_TABLET | ORAL | Status: DC | PRN

## 2013-03-07 MED ORDER — LACTATED RINGERS IV SOLN
INTRAVENOUS | Status: DC
  Administered 2013-03-07 – 2013-03-11 (×13): via INTRAVENOUS

## 2013-03-07 MED ORDER — ACETAMINOPHEN 325 MG PO TABS
650.0000 mg | ORAL_TABLET | ORAL | Status: DC | PRN
  Administered 2013-03-09 – 2013-03-11 (×5): 650 mg via ORAL
  Filled 2013-03-07 (×5): qty 2

## 2013-03-07 MED ORDER — LABETALOL HCL 5 MG/ML IV SOLN
10.0000 mg | INTRAVENOUS | Status: DC | PRN
  Administered 2013-03-08: 10 mg via INTRAVENOUS
  Administered 2013-03-11: 40 mg via INTRAVENOUS
  Administered 2013-03-11: 10 mg via INTRAVENOUS
  Administered 2013-03-11: 20 mg via INTRAVENOUS
  Filled 2013-03-07 (×7): qty 4

## 2013-03-07 MED ORDER — PRENATAL MULTIVITAMIN CH
1.0000 | ORAL_TABLET | Freq: Every day | ORAL | Status: DC
  Administered 2013-03-08 – 2013-03-11 (×4): 1 via ORAL
  Filled 2013-03-07 (×4): qty 1

## 2013-03-07 MED ORDER — METHYLDOPA 500 MG PO TABS
750.0000 mg | ORAL_TABLET | Freq: Once | ORAL | Status: AC
  Administered 2013-03-07: 750 mg via ORAL
  Filled 2013-03-07: qty 1

## 2013-03-07 NOTE — MAU Note (Addendum)
Patient presents to MAU after call from physician's office advising follow-up from today's office visit/lab results. Reports HA from earlier has subsided; denies blurred vision, RUQ pain, or other lateralizing s/s.  Denies vaginal bleeding, LOF, cramping. Reports +FM.  Patient reports last dose of Aldomet 500 mg taken today at 1730.

## 2013-03-07 NOTE — H&P (Signed)
Veronica Griffin is a 22 y.o. female, G2P0010 at 35 weeks, presenting for admission due to elevated BP and protein/creatnine ratio of 0.9.  BPs were 130-140/100s--had been on Aldomet 500 mg po TID, with order by Veronica Griffin to increase to 750 po TID today.  PIH labs were WNL from the office, and BPP 8/8 with normal AFI.  Denies HA, visual symptoms, or epigastric pain.  Does have some pedal edema.  Patient Active Problem List   Diagnosis Date Noted  . Pre-eclampsia added to pre-existing hypertension 03/07/2013  . Hgb E 03/07/2013  . Chronic hypertension in pregnancy 10/10/2012  . Vaginal bleeding in pregnancy 10/08/2012   History of present pregnancy: Patient entered care at 13 weeks--had been seen at Urgent Care for dx of pregnancy at approx 10 weeks, with elevated BP noted then.  Started on Aldomet 250 mg po BID then.  EDC of 04/12/13 was established by [place] at 10 weeks.   Anatomy scan:  17 6/7 weeks, with normal findings and an posterior placenta, limited cardiac anatomy.   Additional [place] evaluations:   21 weeks--f/u heart anatomy, normal growth 27 weeks--EFW 2+7, 28.7%ile, normal fluid 32 weeks--EFW 4+1, 15%ile, AC 11%, vtx, normal fluid Followed with BPPs weekly since 32 weeks--today was 8/8, normal fluid, vtx  Significant prenatal events:  Had early pregnancy bleeding, with large [facility] noted.  24 hour urine and PIH labs done at 21 weeks--WNL. Referred to cardiology at 24 weeks due to palpitations--Aldomet was also decreased from 250 mg po TID to BID.   Had 2D Echo that was WNL.  No cardiologist note in chart.  PIH labs and 24 hour urine repeated 5/22 due to elevated BP--WNL.  Aldomet increased to 500 mg po BID at that time.  Had kidney stone 4/14, and was seen in MAU--placed on Cephalexin.  Did not take Tylenol #3 that was ordered.    Last evaluation: Today in office, with BPs 130-140/100s.  PIH lab were WNL, and protein creatnine ratio was elevated at 0.9.  History OB History   Grav Para  Term Preterm Abortions TAB SAB Ect Mult Living   2    1         01/2012--TAB  Past Medical History  Diagnosis Date  . Hypertension     Taking Aldomet 250mg  TID   Past Surgical History  Procedure Laterality Date  . No past surgeries     Family History: family history includes Hypertension in her maternal aunt and mother.  Social History:  reports that she quit smoking about 15 months ago. Her smoking use included Cigarettes. She smoked 0.00 packs per day. She has never used smokeless tobacco. She reports that she drinks about 1.0 ounces of alcohol per week. She reports that she does not use illicit drugs.  [name], Veronica Griffin, has been involved and supportive.  Patient has not been working.   Prenatal Transfer Tool  Maternal Diabetes: No Genetic Screening: Normal Quad Maternal Ultrasounds/Referrals: Normal--last [place] 5/22, with growth at 15%, AC at 11%, normal fluid Fetal Ultrasounds or other Referrals:  None Maternal Substance Abuse:  No Significant Maternal Medications:  Meds include: Other: Aldomet 750 mg po TID Significant Maternal Lab Results:  Lab values include: Other: GBS pending from admission 03/08/13. 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    Blood pressure 143/90, pulse 66, temperature 98.2 F (36.8 C), resp. rate 18, height 5\' 6"  (1.676 m), weight 180 lb  6.4 oz (81.829 kg), last menstrual period 06/05/2012, SpO2 99.00%.  Filed Vitals:   03/07/13 2003 03/07/13 2018 03/07/13 2033 03/07/13 2048  BP: 153/97 140/95 134/74 143/90  Pulse: 67 67 60 66  Temp:      Resp:      Height:      Weight:      SpO2:       Exam Physical Exam  Chest clear Heart RRR without murmur Abd gravid, NT Pelvic--deferred Ext DTR 1+ without clonus, 1+ edema  FHR Category 1 UCs none  Prenatal labs: ABO, Rh: B/POS/-- (12/23 1429) Antibody: NEG (12/23 1429) Rubella: 1.04 (12/23 1429) RPR: NON REAC (12/23 1429)  HBsAg: NEGATIVE (12/23  1429)  HIV: NON REACTIVE (12/23 1429)  GBS:   Pending 03/08/13 Glucola WNL Hgb 12 at NOB, 10.7 at 27 weeks PIH labs WNL during pregnancy, last set 03/08/13 in office Protein/creatnine ratio 0.9 on day of admission Quad screen WNL Hgb electrophoresis Hgb E Pap WNL  10/2012  Assessment/Plan: IUP at 35 weeks Chronic hypertension with superimposed pre-eclampsia Hgb E  Plan: Admit to Birthing Suite per consult with Veronica Griffin Routine antenatal orders [place] for growth and fluid in am Aldomet 750 mg po TID--next dose due at 10 pm Labetalol IV prn for BP elevations. GBS today. PIH labs daily Daily weight Anticipate consideration for delivery at 37 weeks, unless deterioration of labs, FHR status, or uncontrolled BP.  Veronica Griffin 03/07/2013, 9:31 PM

## 2013-03-07 NOTE — MAU Note (Signed)
Seen in office earlier today and blood work and urine collected. Called few hours later and told I needed to come in.

## 2013-03-08 ENCOUNTER — Inpatient Hospital Stay (HOSPITAL_COMMUNITY): Payer: Medicaid Other

## 2013-03-08 LAB — COMPREHENSIVE METABOLIC PANEL
Albumin: 2.4 g/dL — ABNORMAL LOW (ref 3.5–5.2)
BUN: 11 mg/dL (ref 6–23)
CO2: 21 mEq/L (ref 19–32)
Chloride: 105 mEq/L (ref 96–112)
Creatinine, Ser: 0.67 mg/dL (ref 0.50–1.10)
GFR calc non Af Amer: >90 mL/min (ref >90)
Total Bilirubin: 0.1 mg/dL — ABNORMAL LOW (ref 0.3–1.2)

## 2013-03-08 LAB — CBC
MCHC: 34.1 g/dL (ref 30.0–36.0)
RDW: 14.1 % (ref 11.5–15.5)

## 2013-03-08 LAB — LACTATE DEHYDROGENASE: LDH: 131 U/L (ref 94–250)

## 2013-03-08 LAB — URIC ACID: Uric Acid, Serum: 5.5 mg/dL (ref 2.4–7.0)

## 2013-03-09 LAB — COMPREHENSIVE METABOLIC PANEL
Alkaline Phosphatase: 65 U/L (ref 39–117)
BUN: 10 mg/dL (ref 6–23)
Chloride: 106 mEq/L (ref 96–112)
GFR calc Af Amer: >90 mL/min (ref >90)
Glucose, Bld: 87 mg/dL (ref 70–99)
Potassium: 3.9 mEq/L (ref 3.5–5.1)
Total Bilirubin: 0.1 mg/dL — ABNORMAL LOW (ref 0.3–1.2)

## 2013-03-09 LAB — LACTATE DEHYDROGENASE: LDH: 118 U/L (ref 94–250)

## 2013-03-09 LAB — CBC
Hemoglobin: 10.1 g/dL — ABNORMAL LOW (ref 12.0–15.0)
MCH: 25.5 pg — ABNORMAL LOW (ref 26.0–34.0)
MCHC: 34.2 g/dL (ref 30.0–36.0)

## 2013-03-09 MED ORDER — LACTATED RINGERS IV BOLUS (SEPSIS)
500.0000 mL | Freq: Once | INTRAVENOUS | Status: AC
  Administered 2013-03-09: 500 mL via INTRAVENOUS

## 2013-03-09 NOTE — Progress Notes (Signed)
  Subjective: Called to [facility] for decel.  Pt was put on her left side to resolve the decel.  C/o "headache" just located in neck.  Routine dose of Aldomet given at 1615.  Pt was hoping to meet with family off the unit around 1800.  Objective: BP 162/88  Pulse 55  Temp(Src) 97.9 F (36.6 C) (Oral)  Resp 18  Ht 5\' 6"  (1.676 m)  Wt 181 lb 9.6 oz (82.373 kg)  BMI 29.32 kg/m2  SpO2 99%  LMP 06/05/2012     Filed Vitals:   03/09/13 1000 03/09/13 1244 03/09/13 1616 03/09/13 1701  BP:  142/93 140/88 162/88  Pulse:  69 63 55  Temp:  98.3 F (36.8 C) 97.9 F (36.6 C)   TempSrc:  Oral Oral   Resp:  20 18   Height:      Weight: 181 lb 9.6 oz (82.373 kg)     SpO2:         FHT:  FHR: 145 bpm, variability: moderate,  accelerations:  Present,  decelerations:  Present 1 x decel at 1654, down to 45 resolving in 5 min back to baseline with moderate variability. UC:   none   Assessment / Plan: Deceleration at 1654 "Headache" left side of neck  Position change to left side Tylenol 650 mg  C/w Dr. Estanislado Pandy Pt will need to remain on the monitor and her family will need to come to her room to visit.   Haroldine Laws 03/09/2013, 5:11 PM

## 2013-03-09 NOTE — Progress Notes (Signed)
Hospital day # 2 pregnancy at [redacted]w[redacted]d CHTN with superimposed pre-eclampsia on Aldomet 750 mg TID  S: well, reports good fetal activity. Denies headache, visual symptoms or abdominal pain      Reports improvement in edema      Contractions:none      Vaginal bleeding:none now       Vaginal discharge: no significant change  O: BP 142/93  Pulse 69  Temp(Src) 98.3 F (36.8 C) (Oral)  Resp 20  Ht 5\' 6"  (1.676 m)  Wt 181 lb 9.6 oz (82.373 kg)  BMI 29.32 kg/m2  SpO2 99%  LMP 06/05/2012      BP varies  130-140/ 90-100      Fetal tracings:reviewed and reassuring      Uterus gravid and non-tender      Extremities: edema 1+ and Homans sign is negative, no sign of DVT DTR 1-2/4 without clonus  Labs: normal with uric acid at 5.5  A: [redacted]w[redacted]d with CHTN with superimposed pre-eclampsia     stable  P: continue current plan of care     Plan delivery at 37 weeks or earlier if severe pre-eclampsia     MFM consult in am: patient would like outpatient management  Veronica Griffin A  MD 03/09/2013 1:12 PM

## 2013-03-10 ENCOUNTER — Inpatient Hospital Stay (HOSPITAL_COMMUNITY): Payer: Medicaid Other

## 2013-03-10 ENCOUNTER — Encounter (HOSPITAL_COMMUNITY): Payer: Medicaid Other

## 2013-03-10 LAB — COMPREHENSIVE METABOLIC PANEL
ALT: 7 U/L (ref 0–35)
Calcium: 8.8 mg/dL (ref 8.4–10.5)
Creatinine, Ser: 0.62 mg/dL (ref 0.50–1.10)
GFR calc Af Amer: >90 mL/min (ref >90)
Glucose, Bld: 85 mg/dL (ref 70–99)
Sodium: 137 mEq/L (ref 135–145)
Total Protein: 5.4 g/dL — ABNORMAL LOW (ref 6.0–8.3)

## 2013-03-10 LAB — CBC
MCV: 74.6 fL — ABNORMAL LOW (ref 78.0–100.0)
Platelets: 202 10*3/uL (ref 150–400)
RBC: 4.21 MIL/uL (ref 3.87–5.11)
WBC: 7.7 10*3/uL (ref 4.0–10.5)

## 2013-03-10 LAB — URIC ACID: Uric Acid, Serum: 5.4 mg/dL (ref 2.4–7.0)

## 2013-03-10 LAB — LACTATE DEHYDROGENASE: LDH: 126 U/L (ref 94–250)

## 2013-03-10 MED ORDER — BUTORPHANOL TARTRATE 1 MG/ML IJ SOLN
1.0000 mg | Freq: Once | INTRAMUSCULAR | Status: AC
  Administered 2013-03-10: 1 mg via INTRAVENOUS
  Filled 2013-03-10: qty 1

## 2013-03-10 MED ORDER — LABETALOL HCL 300 MG PO TABS
300.0000 mg | ORAL_TABLET | Freq: Three times a day (TID) | ORAL | Status: DC
  Administered 2013-03-10 – 2013-03-11 (×5): 300 mg via ORAL
  Filled 2013-03-10 (×7): qty 1

## 2013-03-10 MED ORDER — PROMETHAZINE HCL 25 MG/ML IJ SOLN
12.5000 mg | Freq: Once | INTRAMUSCULAR | Status: AC
  Administered 2013-03-10: 12.5 mg via INTRAVENOUS
  Filled 2013-03-10: qty 1

## 2013-03-10 NOTE — Progress Notes (Addendum)
Hospital day # 3 pregnancy at [redacted]w[redacted]d--Chronic hypertension with superimposed pre-eclampsia.  S:  Sleeping at present--RN reports patient was w/o c/o during night, other than mild HA that was relieved with Tylenol      Had baby shower yesterday afternoon, with family coming to her room.  Was unable to go down to classroom due          to sporadic decels.  O: BP 150/95  Pulse 65  Temp(Src) 98.4 F (36.9 C) (Oral)  Resp 18  Ht 5\' 6"  (1.676 m)  Wt 181 lb 9.6 oz (82.373 kg)  BMI 29.32 kg/m2  SpO2 99%  LMP 06/05/2012       Fetal tracings:  Overall reassuring, with cycles of reactivity and moderate variables--sporadic occurrence of                 prolonged spontaneous decels, with duration of 4-5 min, down to 70 pbm at lowest point.  Last one at 6:12am.      No association with contractions.  Usually occur when patient on left side.      Contractions:   Very occasional, mild.      Uterus non-tender      Extremities: no significant edema and no signs of DVT          Labs:   Results for orders placed during the hospital encounter of 03/07/13 (from the past 24 hour(s))  COMPREHENSIVE METABOLIC PANEL     Status: Abnormal   Collection Time    03/10/13  6:00 AM      Result Value Range   Sodium 137  135 - 145 mEq/L   Potassium 4.0  3.5 - 5.1 mEq/L   Chloride 104  96 - 112 mEq/L   CO2 24  19 - 32 mEq/L   Glucose, Bld 85  70 - 99 mg/dL   BUN 8  6 - 23 mg/dL   Creatinine, Ser 4.09  0.50 - 1.10 mg/dL   Calcium 8.8  8.4 - 81.1 mg/dL   Total Protein 5.4 (*) 6.0 - 8.3 g/dL   Albumin 2.3 (*) 3.5 - 5.2 g/dL   AST 10  0 - 37 U/L   ALT 7  0 - 35 U/L   Alkaline Phosphatase 67  39 - 117 U/L   Total Bilirubin 0.2 (*) 0.3 - 1.2 mg/dL   GFR calc non Af Amer >90  >90 mL/min   GFR calc Af Amer >90  >90 mL/min  LACTATE DEHYDROGENASE     Status: None   Collection Time    03/10/13  6:00 AM      Result Value Range   LDH 126  94 - 250 U/L  URIC ACID     Status: None   Collection Time    03/10/13  6:00  AM      Result Value Range   Uric Acid, Serum 5.4  2.4 - 7.0 mg/dL  CBC     Status: Abnormal   Collection Time    03/10/13  6:00 AM      Result Value Range   WBC 7.7  4.0 - 10.5 K/uL   RBC 4.21  3.87 - 5.11 MIL/uL   Hemoglobin 10.5 (*) 12.0 - 15.0 g/dL   HCT 91.4 (*) 78.2 - 95.6 %   MCV 74.6 (*) 78.0 - 100.0 fL   MCH 24.9 (*) 26.0 - 34.0 pg   MCHC 33.4  30.0 - 36.0 g/dL   RDW 21.3  08.6 - 57.8 %  Platelets 202  150 - 400 K/uL   GBS pending from 03/07/13       Meds: Aldomet 750 mg po TID--doses at 10a, 4p, 10p                 IV Labetalol prn elevated BP syst >/= 160, diast >/= 105--required one dose yesterday at 7:12p  A: [redacted]w[redacted]d with chronic hypertension with superimposed pre-eclampsia     Sporadic FHR decels     PIH labs stable      P: Continue current plan of care      Upcoming tests/treatments:  MFM consult ordered for today                                                     Daily PIH labs      GBS pending      MDs will follow  Nigel Bridgeman CNM, [place] 03/10/2013 7:39 AM  MFM consult appreciated.  Pt c/o neck pain will give dose of tylenol now she said it helped previously.  BP med changed from aldomet to labetalol 300mg  po TID per MFM recs.

## 2013-03-10 NOTE — Consult Note (Signed)
Maternal Fetal Medicine Consultation  Requesting Provider(s): Silverio Lay, MD  Reason for consultation: Superimposed preeclampsia  HPI: Veronica Griffin is a 22yo G2P0010 currently at 35 3/7 weeks admitted due to elevated blood pressures and suspected superimposed preeclampsia.  She reports that she was first diagnosed with chronic hypertension shortly before her pregnancy - was previously maintained on Methyl Dopa 500 mg BID that was recently increased to 750 mg TID.  Her admission Protein/Creat ratio was 0.9.  Her baseline 24 hr urine protein (5/24) was 192 mg/24 hrs.  The remainder of the admission labs were within normal limits. Ultrasound on 6/7 showed an estimated fetal weight of 1991 g (14th %tile) with an AC measuring at the 5th %tile.  Since admission, her blood pressures have been labile - with some severe range blood pressures.  She denies HA, visual changes or RUQ pain.  OB History: OB History   Grav Para Term Preterm Abortions TAB SAB Ect Mult Living   2    1           PMH:  Past Medical History  Diagnosis Date  . Hypertension     Taking Aldomet 250mg  TID    PSH:  Past Surgical History  Procedure Laterality Date  . No past surgeries     Meds:  Scheduled Meds: . docusate sodium  100 mg Oral Daily  . methyldopa  750 mg Oral TID  . prenatal multivitamin  1 tablet Oral Q1200   Continuous Infusions: . lactated ringers 125 mL/hr at 03/10/13 0225   PRN Meds:.acetaminophen, calcium carbonate, labetalol, zolpidem  Allergies: No Known Allergies  FH: Family history of hypertension.  Denies family history of birth defects or hereditary disorders Soc:  Review of Systems: no vaginal bleeding or cramping/contractions, no LOF, no nausea/vomiting. All other systems reviewed and are negative.   PE:   Filed Vitals:   03/10/13 0743  BP: 128/82  Pulse: 60  Temp: 98 F (36.7 C)  Resp: 18    GEN: well-appearing female ABD: gravid, NT Ext: 2+ pitting edema, 2+ DTRs  no clonus  Ultrasound: Single IUP at 35 3/7 weeks Superimposed preeclampsia Estimated fetal weight at 14th %tile; AC at the 5th %tile with normal UA Dopplers on 6/7. Active fetus with BPP of 8/8 Normal amniotic fluid volume  Labs: CBC    Component Value Date/Time   WBC 7.7 03/10/2013 0600   RBC 4.21 03/10/2013 0600   HGB 10.5* 03/10/2013 0600   HCT 31.4* 03/10/2013 0600   PLT 202 03/10/2013 0600   MCV 74.6* 03/10/2013 0600   MCH 24.9* 03/10/2013 0600   MCHC 33.4 03/10/2013 0600   RDW 14.0 03/10/2013 0600   LYMPHSABS 1.7 01/13/2013 0545   MONOABS 0.9 01/13/2013 0545   EOSABS 0.1 01/13/2013 0545   BASOSABS 0.0 01/13/2013 0545   CMP     Component Value Date/Time   NA 137 03/10/2013 0600   K 4.0 03/10/2013 0600   CL 104 03/10/2013 0600   CO2 24 03/10/2013 0600   GLUCOSE 85 03/10/2013 0600   BUN 8 03/10/2013 0600   CREATININE 0.62 03/10/2013 0600   CREATININE 0.64 11/04/2012 0908   CALCIUM 8.8 03/10/2013 0600   PROT 5.4* 03/10/2013 0600   ALBUMIN 2.3* 03/10/2013 0600   AST 10 03/10/2013 0600   ALT 7 03/10/2013 0600   ALKPHOS 67 03/10/2013 0600   BILITOT 0.2* 03/10/2013 0600   GFRNONAA >90 03/10/2013 0600   GFRAA >90 03/10/2013 0600     A/P: 1) Single  IUP at 35 3/7 weeks         2) Superimposed preeclampsia -  Based on new onset of proteinuria and some severe range blood pressures, I do not feel that the patient is a candidate for outpatient management.  I discussed this recommendation with the patient and the concerns that I have that this might progress rapidly.         3) Asymmetric fetal growth (EFW at the 14th %tile, AC at the 5th %tile)   Recommend: 1) Would discontinue Aldomet 2) Would start Labetalol 300 mg TID.  Although Labetalol dose can be titrated to a maximum dose of 800mg  TID, I would be hesitant to do so given her current gestational age.  If the patient continues to have severe range blood pressures (SBP >160 or DBP>110) at this dose, would recommend delivery. 3) Recommend preeclampsia labs 2-3x /  week. 4) At least daily NSTs and weekly UA Dopplers due to asymmetric fetal growth 5) Would recommend delivery for the development of any severe features (thrombocytopenia, LFTs . 2x normal, severe headache, visual changes, oliguria, pulmonary edema), non reassuring fetal testing.  If the patient is otherwise stable, recommend delivery at 37 weeks.  Thank you for the opportunity to be a part of the care of [place]. Please contact our office if we can be of further assistance.   I spent approximately 30 minutes with this patient with over 50% of time spent in face-to-face counseling.  Alpha Gula, MD Maternal Fetal Medicine

## 2013-03-11 LAB — COMPREHENSIVE METABOLIC PANEL
AST: 10 U/L (ref 0–37)
AST: 16 U/L (ref 0–37)
Albumin: 2.3 g/dL — ABNORMAL LOW (ref 3.5–5.2)
Alkaline Phosphatase: 67 U/L (ref 39–117)
CO2: 25 mEq/L (ref 19–32)
CO2: 20 mEq/L (ref 19–32)
Calcium: 9.7 mg/dL (ref 8.4–10.5)
Chloride: 106 mEq/L (ref 96–112)
Chloride: 102 mEq/L (ref 96–112)
Creatinine, Ser: 0.64 mg/dL (ref 0.50–1.10)
Creatinine, Ser: 0.65 mg/dL (ref 0.50–1.10)
GFR calc Af Amer: >90 mL/min (ref >90)
GFR calc non Af Amer: >90 mL/min (ref >90)
GFR calc non Af Amer: >90 mL/min (ref >90)
Glucose, Bld: 97 mg/dL (ref 70–99)
Potassium: 3.8 mEq/L (ref 3.5–5.1)
Total Bilirubin: 0.1 mg/dL — ABNORMAL LOW (ref 0.3–1.2)
Total Bilirubin: 0.1 mg/dL — ABNORMAL LOW (ref 0.3–1.2)

## 2013-03-11 LAB — URIC ACID
Uric Acid, Serum: 5.3 mg/dL (ref 2.4–7.0)
Uric Acid, Serum: 5.2 mg/dL (ref 2.4–7.0)

## 2013-03-11 LAB — CBC
HCT: 29.8 % — ABNORMAL LOW (ref 36.0–46.0)
HCT: 32.4 % — ABNORMAL LOW (ref 36.0–46.0)
Hemoglobin: 10.2 g/dL — ABNORMAL LOW (ref 12.0–15.0)
Hemoglobin: 11.4 g/dL — ABNORMAL LOW (ref 12.0–15.0)
MCH: 25.4 pg — ABNORMAL LOW (ref 26.0–34.0)
MCH: 26.3 pg (ref 26.0–34.0)
MCHC: 34.2 g/dL (ref 30.0–36.0)
MCHC: 35.2 g/dL (ref 30.0–36.0)
MCV: 74.3 fL — ABNORMAL LOW (ref 78.0–100.0)
MCV: 74.8 fL — ABNORMAL LOW (ref 78.0–100.0)
RBC: 4.33 MIL/uL (ref 3.87–5.11)
RDW: 14 % (ref 11.5–15.5)

## 2013-03-11 LAB — TYPE AND SCREEN: Antibody Screen: NEGATIVE

## 2013-03-11 LAB — CULTURE, BETA STREP (GROUP B ONLY)

## 2013-03-11 MED ORDER — LACTATED RINGERS IV SOLN: 500.0000 mL | INTRAVENOUS | Status: DC | PRN

## 2013-03-11 MED ORDER — OXYCODONE-ACETAMINOPHEN 5-325 MG PO TABS: 1.0000 | ORAL_TABLET | ORAL | Status: DC | PRN

## 2013-03-11 MED ORDER — HYDRALAZINE HCL 20 MG/ML IJ SOLN
5.0000 mg | INTRAMUSCULAR | Status: DC | PRN
  Administered 2013-03-12: 5 mg via INTRAVENOUS
  Filled 2013-03-11: qty 1

## 2013-03-11 MED ORDER — MAGNESIUM SULFATE 40 G IN LACTATED RINGERS - SIMPLE
2.0000 g/h | INTRAVENOUS | Status: DC
  Administered 2013-03-11 – 2013-03-12 (×2): 2 g/h via INTRAVENOUS
  Filled 2013-03-11 (×2): qty 500

## 2013-03-11 MED ORDER — ONDANSETRON HCL 4 MG/2ML IJ SOLN: 4.0000 mg | Freq: Four times a day (QID) | INTRAMUSCULAR | Status: DC | PRN

## 2013-03-11 MED ORDER — OXYTOCIN BOLUS FROM INFUSION: 500.0000 mL | INTRAVENOUS | Status: DC

## 2013-03-11 MED ORDER — FLEET ENEMA 7-19 GM/118ML RE ENEM: 1.0000 | ENEMA | RECTAL | Status: DC | PRN

## 2013-03-11 MED ORDER — HYDRALAZINE HCL 20 MG/ML IJ SOLN
5.0000 mg | Freq: Once | INTRAMUSCULAR | Status: AC
  Administered 2013-03-11: 18:00:00 via INTRAVENOUS
  Filled 2013-03-11: qty 1

## 2013-03-11 MED ORDER — OXYTOCIN 40 UNITS IN LACTATED RINGERS INFUSION - SIMPLE MED
1.0000 m[IU]/min | INTRAVENOUS | Status: DC
  Administered 2013-03-11: 1 m[IU]/min via INTRAVENOUS
  Filled 2013-03-11: qty 1000

## 2013-03-11 MED ORDER — LACTATED RINGERS IV SOLN
INTRAVENOUS | Status: DC
  Administered 2013-03-11 – 2013-03-12 (×2): via INTRAVENOUS

## 2013-03-11 MED ORDER — LIDOCAINE HCL (PF) 1 % IJ SOLN
30.0000 mL | INTRAMUSCULAR | Status: DC | PRN
  Administered 2013-03-12: 30 mL via SUBCUTANEOUS
  Filled 2013-03-11 (×2): qty 30

## 2013-03-11 MED ORDER — ACETAMINOPHEN 325 MG PO TABS: 650.0000 mg | ORAL_TABLET | ORAL | Status: DC | PRN

## 2013-03-11 MED ORDER — IBUPROFEN 600 MG PO TABS: 600.0000 mg | ORAL_TABLET | Freq: Four times a day (QID) | ORAL | Status: DC | PRN

## 2013-03-11 MED ORDER — MAGNESIUM SULFATE BOLUS VIA INFUSION
4.0000 g | Freq: Once | INTRAVENOUS | Status: AC
  Administered 2013-03-11: 4 g via INTRAVENOUS
  Filled 2013-03-11: qty 500

## 2013-03-11 MED ORDER — TERBUTALINE SULFATE 1 MG/ML IJ SOLN: 0.2500 mg | Freq: Once | INTRAMUSCULAR | Status: AC | PRN

## 2013-03-11 MED ORDER — HYDRALAZINE HCL 20 MG/ML IJ SOLN
INTRAMUSCULAR | Status: AC
  Administered 2013-03-11: 10 mg via INTRAVENOUS
  Filled 2013-03-11: qty 1

## 2013-03-11 MED ORDER — FENTANYL CITRATE 0.05 MG/ML IJ SOLN
100.0000 ug | INTRAMUSCULAR | Status: DC | PRN
  Administered 2013-03-11: 100 ug via INTRAVENOUS
  Filled 2013-03-11: qty 2

## 2013-03-11 MED ORDER — BUTORPHANOL TARTRATE 1 MG/ML IJ SOLN: 1.0000 mg | INTRAMUSCULAR | Status: DC | PRN

## 2013-03-11 MED ORDER — CITRIC ACID-SODIUM CITRATE 334-500 MG/5ML PO SOLN: 30.0000 mL | ORAL | Status: DC | PRN

## 2013-03-11 MED ORDER — OXYTOCIN 40 UNITS IN LACTATED RINGERS INFUSION - SIMPLE MED
62.5000 mL/h | INTRAVENOUS | Status: DC
  Administered 2013-03-12: 62.5 mL/h via INTRAVENOUS

## 2013-03-11 NOTE — Progress Notes (Signed)
  Subjective: Resting comfortably--had mild HA, but resolved with nap.  Denies visual sx, current HA, or epigastric pain.  Objective: BP 133/91  Pulse 78  Temp(Src) 98 F (36.7 C) (Oral)  Resp 18  Ht 5\' 6"  (1.676 m)  Wt 182 lb 14.4 oz (82.963 kg)  BMI 29.53 kg/m2  SpO2 100%  LMP 06/05/2012 I/O last 3 completed shifts: In: 1665 [P.O.:540; I.V.:1125] Out: 1150 [Urine:1150] Total I/O In: 773.3 [P.O.:320; I.V.:453.3] Out: 350 [Urine:350]  Filed Vitals:   03/11/13 2131 03/11/13 2201 03/11/13 2204 03/11/13 2232  BP: 139/94 138/105 156/106 133/91  Pulse: 85 89 79 78  Temp: 98 F (36.7 C)     TempSrc: Oral     Resp: 18   18  Height:      Weight:      SpO2:       Received Labetalol po dose at 10:29p Held Apresoline dose at that time  FHT: Overall reassuring, with occasional mild variables UC:   regular, every 5 minutes Pitocin on 5 mu/min  Assessment / Plan: Induction for severe pre-eclampsia On pitocin Continue Labetalol 300 mg po TID  Apresoline 5-10 mg IV prn as adjunct med for BP control Dr. Dion Body updated at 10:15pm   Toa Mia 03/11/2013, 11:14 PM

## 2013-03-11 NOTE — Progress Notes (Addendum)
Patient ID: Veronica Griffin, female   DOB: 09/13/1991, 22 y.o.   MRN: [id number]  Hospital day # 4 pregnancy at [redacted]w[redacted]d  S: Reports feeling improved this AM.  States headache that she had yesterday has now resolved.  Denies vision chgs, RUQ pain.  Reports active fetus.  Denies UCs, ROM or bldg.  Has not received IV Labetalol in the past 24hrs.  Currently on Labetalol 300mg  po tid.  Was seen by MFM on 01/08/13 with ultrasound performed on 03/08/13 with asymmetric fetal growth noted (EFW 14%ile; AC 5%ile) but normal umbilical artery dopplers and AFI.  MFM note in chart with recommendations.   O: BP 152/87  Pulse 66  Temp(Src) 98.4 F (36.9 C) (Oral)  Resp 18  Ht 5\' 6"  (1.676 m)  Wt 82.271 kg (181 lb 6 oz)  BMI 29.29 kg/m2  SpO2 99%  LMP 06/05/2012 Filed Vitals:   03/10/13 2300 03/10/13 2312 03/11/13 0000 03/11/13 0832  BP:  125/94  152/87  Pulse:  75  66  Temp:    98.4 F (36.9 C)  TempSrc:    Oral  Resp: 18 18 18 18   Height:      Weight:      SpO2:       BP max 177/106 at 2024 01/08/13 (accompanied by headache) - no labetalol given-was txed with Stadol with relief. Awaiting daily weight for today.  Gen:  Pt alert, oriented, no distress.  Skin W&D.  Color satis. Heart:  RRR Lungs:  CTA bilat Abd:  Soft, NT with pos BS x 4 quads. Fetal tracings:Baseline FHR: 142 bpm                        Variability:  Moderate                        Accels:  10 x 10                        Decels:  None at present                        FHR Cat I  Toco:  Occas irreg UC that pt not aware of occurring.  Uterus: soft, gravid and non-tender  Extremities: trace pedal edema present bilat.  Neg Homan's sign bilat.  SCDs in place.  DTRs 1+ with no clonus bilat.  Results for orders placed during the hospital encounter of 03/07/13 (from the past 24 hour(s))  COMPREHENSIVE METABOLIC PANEL     Status: Abnormal   Collection Time    03/11/13  5:35 AM      Result Value Range   Sodium 138  135 - 145 mEq/L   Potassium 3.8  3.5 - 5.1 mEq/L   Chloride 106  96 - 112 mEq/L   CO2 25  19 - 32 mEq/L   Glucose, Bld 84  70 - 99 mg/dL   BUN 8  6 - 23 mg/dL   Creatinine, Ser 4.09  0.50 - 1.10 mg/dL   Calcium 8.7  8.4 - 81.1 mg/dL   Total Protein 5.3 (*) 6.0 - 8.3 g/dL   Albumin 2.3 (*) 3.5 - 5.2 g/dL   AST 10  0 - 37 U/L   ALT 6  0 - 35 U/L   Alkaline Phosphatase 67  39 - 117 U/L   Total Bilirubin 0.1 (*) 0.3 - 1.2 mg/dL  GFR calc non Af Amer >90  >90 mL/min   GFR calc Af Amer >90  >90 mL/min  LACTATE DEHYDROGENASE     Status: None   Collection Time    03/11/13  5:35 AM      Result Value Range   LDH 126  94 - 250 U/L  URIC ACID     Status: None   Collection Time    03/11/13  5:35 AM      Result Value Range   Uric Acid, Serum 5.3  2.4 - 7.0 mg/dL  CBC     Status: Abnormal   Collection Time    03/11/13  5:35 AM      Result Value Range   WBC 7.6  4.0 - 10.5 K/uL   RBC 4.01  3.87 - 5.11 MIL/uL   Hemoglobin 10.2 (*) 12.0 - 15.0 g/dL   HCT 16.1 (*) 09.6 - 04.5 %   MCV 74.3 (*) 78.0 - 100.0 fL   MCH 25.4 (*) 26.0 - 34.0 pg   MCHC 34.2  30.0 - 36.0 g/dL   RDW 40.9  81.1 - 91.4 %   Platelets 187  150 - 400 K/uL  TYPE AND SCREEN     Status: None   Collection Time    03/11/13  5:35 AM      Result Value Range   ABO/RH(D) B POS     Antibody Screen NEG     Sample Expiration 03/14/2013     A: [redacted]w[redacted]d with chronic hypertension and superimposed preeclampsia.     Asymmetric fetal growth (EFW 14%ile; AC 5%ile)     Stable at present.  P: Continue current plan of care at present and observe closely for worsening preeclampsia.      Will begin intake/output.        Further orders per MD.    Rhona Leavens., CNM 03/11/2013 10:12 AM  Called by CNM about patient with worsening BPs this afternoon with blood pressures ranging from 160-192/90s-109 despite several doses of IV labetalol and hydralazine.  Plan per Dr. Fredda Hammed (MFM) recs is deliver with BPs in severe range or need for increased BP medicine  to control BPs.  Will check labs now and again 6hrs after start of Mg which will be started now as well.  Awaiting CNM to check cervix for definitive plan regarding method of induction and she will confirm vtx as well.  The pt will be transferred from ante to L&D with other routine labor orders.   Last EFW was 4lbs 6oz at 14% but AC was 5% on 6/6.  I have signed out to Dr. Dion Body covering MD for the evening.

## 2013-03-11 NOTE — Progress Notes (Addendum)
  Subjective: Now transferred to L&D for induction--denies HA, visual sx, epigastric pain.  Unaware of contractions.  Reports +FM.  Objective: BP 121/82  Pulse 97  Temp(Src) 98.9 F (37.2 C) (Oral)  Resp 20  Ht 5\' 6"  (1.676 m)  Wt 182 lb 14.4 oz (82.963 kg)  BMI 29.53 kg/m2  SpO2 100%  LMP 06/05/2012 I/O last 3 completed shifts: In: 1665 [P.O.:540; I.V.:1125] Out: 1150 [Urine:1150]   Received Labetalol 300 mg po at 4pm, 40 mg IV at 5:24p Received Apresoline 5 mg IV at 6:05p, 10 mg IV at 6:25p   Filed Vitals:   03/11/13 1851 03/11/13 1855 03/11/13 1903 03/11/13 1909  BP: 129/78  128/71 121/82  Pulse: 93 95 95 97  Temp:    98.9 F (37.2 C)  TempSrc:    Oral  Resp: 20  20   Height:      Weight:      SpO2:  100%       FHT:  Reassuring, no decels. UC:   Occasional, mild. SVE:   Dilation: 3 Effacement (%): 90 Exam by:: v. Josedejesus Marcum cnm Cervix posterior, but very favorable, vtx, -1.  Assessment / Plan: IUP at 35 4/7 weeks Severe pre-eclampsia GBS negative Favorable cervix Growth on 6/7--14%ile, AC at 55%ile.  Plan: Transferred to [facility] for induction Plan pitocin per low dose protocol, since cervix so favorable, with AROM as labor advances. PIH labs now--repeat in 6 hours. RPR with PIH labs Epidural prn. Monitor urine output. Close observation of maternal/fetal status. Will update Dr. Dion Body.  Nigel Bridgeman 03/11/2013, 8:15 PM

## 2013-03-11 NOTE — Progress Notes (Signed)
Patient ID: Veronica Griffin, female   DOB: 1991/04/18, 22 y.o.   MRN: [id number]  Subjective:  Called to Veronica pt due to elevated blood pressure.  Pt reports mild headache.  Denies vision chgs, RUQ pain.  States fetus is active.  Denies UCs, ROM or bldg.  Pt has already recvd 3 doses of IV Labetalol beginning at 1600 and continues on Labetalol 300mg  tid.  Objective: BPs over the past 90 mins: 185/108; 190/105; 160/102; 148/104; 169/99; 182/105; 189/109; 184/110.   FHR 140 bpm baseline; variability moderate; accels-present; decels-absent.  FHR Cat 1 Toco with no UCs noted.  Heart: RRR Lungs:  CTA bilat Abd:  Soft, gravid, NT Uterus:  Soft, NT Extrems:  1+ edema noted DTRs 2+, no clonus present.  Assessment/Plan: IUP at 35w 3d Chronic Hypertension with worsening superimposed preeclampsia.  Dr. Su Hilt notified of elevated blood pressures.  Orders recvd for IV Apresoline.  Will begin Magnesium Sulfate for seizure prophylaxis and transfer to [facility] to begin induction of labor due to worsening preeclampsia.  Elsie Ra, CNM

## 2013-03-12 ENCOUNTER — Encounter (HOSPITAL_COMMUNITY): Payer: Self-pay | Admitting: *Deleted

## 2013-03-12 LAB — MRSA PCR SCREENING: MRSA by PCR: NEGATIVE

## 2013-03-12 LAB — SYPHILIS: RPR W/REFLEX TO RPR TITER AND TREPONEMAL ANTIBODIES, TRADITIONAL SCREENING AND DIAGNOSIS ALGORITHM: RPR Ser Ql: NONREACTIVE

## 2013-03-12 LAB — COMPREHENSIVE METABOLIC PANEL
ALT: 6 U/L (ref 0–35)
Calcium: 8.1 mg/dL — ABNORMAL LOW (ref 8.4–10.5)
GFR calc Af Amer: >90 mL/min (ref >90)
Glucose, Bld: 111 mg/dL — ABNORMAL HIGH (ref 70–99)
Sodium: 137 mEq/L (ref 135–145)
Total Protein: 5.2 g/dL — ABNORMAL LOW (ref 6.0–8.3)

## 2013-03-12 LAB — MAGNESIUM: Magnesium: 4.3 mg/dL — ABNORMAL HIGH (ref 1.5–2.5)

## 2013-03-12 LAB — CBC
Hemoglobin: 10 g/dL — ABNORMAL LOW (ref 12.0–15.0)
MCH: 24.9 pg — ABNORMAL LOW (ref 26.0–34.0)
MCHC: 33.8 g/dL (ref 30.0–36.0)
Platelets: 178 10*3/uL (ref 150–400)

## 2013-03-12 MED ORDER — OXYCODONE-ACETAMINOPHEN 5-325 MG PO TABS
1.0000 | ORAL_TABLET | ORAL | Status: DC | PRN
  Administered 2013-03-12 (×2): 1 via ORAL
  Filled 2013-03-12 (×2): qty 1

## 2013-03-12 MED ORDER — ZOLPIDEM TARTRATE 5 MG PO TABS: 5.0000 mg | ORAL_TABLET | Freq: Every evening | ORAL | Status: DC | PRN

## 2013-03-12 MED ORDER — DIPHENHYDRAMINE HCL 25 MG PO CAPS: 25.0000 mg | ORAL_CAPSULE | Freq: Four times a day (QID) | ORAL | Status: DC | PRN

## 2013-03-12 MED ORDER — DIBUCAINE 1 % RE OINT: 1.0000 "application " | TOPICAL_OINTMENT | RECTAL | Status: DC | PRN

## 2013-03-12 MED ORDER — LACTATED RINGERS IV SOLN
INTRAVENOUS | Status: DC
  Administered 2013-03-12 (×3): via INTRAVENOUS

## 2013-03-12 MED ORDER — ONDANSETRON HCL 4 MG PO TABS: 4.0000 mg | ORAL_TABLET | ORAL | Status: DC | PRN

## 2013-03-12 MED ORDER — LABETALOL HCL 300 MG PO TABS
300.0000 mg | ORAL_TABLET | Freq: Three times a day (TID) | ORAL | Status: DC
  Administered 2013-03-12 – 2013-03-13 (×4): 300 mg via ORAL
  Filled 2013-03-12 (×7): qty 1

## 2013-03-12 MED ORDER — TETANUS-DIPHTH-ACELL PERTUSSIS 5-2.5-18.5 LF-MCG/0.5 IM SUSP
0.5000 mL | Freq: Once | INTRAMUSCULAR | Status: AC
  Administered 2013-03-13: 0.5 mL via INTRAMUSCULAR
  Filled 2013-03-12: qty 0.5

## 2013-03-12 MED ORDER — WITCH HAZEL-GLYCERIN EX PADS: 1.0000 "application " | MEDICATED_PAD | CUTANEOUS | Status: DC | PRN

## 2013-03-12 MED ORDER — SIMETHICONE 80 MG PO CHEW: 80.0000 mg | CHEWABLE_TABLET | ORAL | Status: DC | PRN

## 2013-03-12 MED ORDER — PRENATAL MULTIVITAMIN CH
1.0000 | ORAL_TABLET | Freq: Every day | ORAL | Status: DC
  Administered 2013-03-12 – 2013-03-14 (×3): 1 via ORAL
  Filled 2013-03-12 (×3): qty 1

## 2013-03-12 MED ORDER — LANOLIN HYDROUS EX OINT: TOPICAL_OINTMENT | CUTANEOUS | Status: DC | PRN

## 2013-03-12 MED ORDER — PHENYLEPHRINE 40 MCG/ML (10ML) SYRINGE FOR IV PUSH (FOR BLOOD PRESSURE SUPPORT)
PREFILLED_SYRINGE | INTRAVENOUS | Status: AC
  Filled 2013-03-12: qty 5

## 2013-03-12 MED ORDER — IBUPROFEN 600 MG PO TABS
600.0000 mg | ORAL_TABLET | Freq: Four times a day (QID) | ORAL | Status: DC
  Administered 2013-03-12 – 2013-03-14 (×10): 600 mg via ORAL
  Filled 2013-03-12 (×10): qty 1

## 2013-03-12 MED ORDER — FENTANYL 2.5 MCG/ML BUPIVACAINE 1/10 % EPIDURAL INFUSION (WH - ANES)
INTRAMUSCULAR | Status: AC
  Filled 2013-03-12: qty 125

## 2013-03-12 MED ORDER — EPHEDRINE 5 MG/ML INJ
INTRAVENOUS | Status: AC
  Filled 2013-03-12: qty 4

## 2013-03-12 MED ORDER — IBUPROFEN 600 MG PO TABS: 600.0000 mg | ORAL_TABLET | Freq: Four times a day (QID) | ORAL | Status: DC

## 2013-03-12 MED ORDER — SENNOSIDES-DOCUSATE SODIUM 8.6-50 MG PO TABS
2.0000 | ORAL_TABLET | Freq: Every day | ORAL | Status: DC
  Administered 2013-03-12 – 2013-03-13 (×2): 2 via ORAL

## 2013-03-12 MED ORDER — BENZOCAINE-MENTHOL 20-0.5 % EX AERO
1.0000 "application " | INHALATION_SPRAY | CUTANEOUS | Status: DC | PRN
  Administered 2013-03-12: 1 via TOPICAL
  Filled 2013-03-12: qty 56

## 2013-03-12 MED ORDER — ONDANSETRON HCL 4 MG/2ML IJ SOLN: 4.0000 mg | INTRAMUSCULAR | Status: DC | PRN

## 2013-03-13 MED ORDER — LABETALOL HCL 200 MG PO TABS
400.0000 mg | ORAL_TABLET | Freq: Three times a day (TID) | ORAL | Status: DC
Start: 2013-03-13 — End: 2013-03-14
  Administered 2013-03-13 – 2013-03-14 (×4): 400 mg via ORAL
  Filled 2013-03-13 (×7): qty 2

## 2013-03-13 NOTE — Progress Notes (Addendum)
Post Partum Day 0.5 Subjective: Reports feeling well.  Ambulating, voiding and tol po liquids and solids without difficulty.  Baby in NICU and stable per pt. Denies toxic s/s.    Objective: BP max since delivery 144/99. Afebrile.  VSS  Physical Exam:  General: alert, cooperative and no distress Lochia: appropriate, sm rubra Uterine Fundus: firm, NT  Incision: Perineum intact DVT Evaluation: No evidence of DVT seen on physical exam. Negative Homan's sign bilat. No significant calf/ankle edema. Filed Weights   03/11/13 1000 03/12/13 0438   Weight: 182 lb 14.4 oz (82.963 kg) 180 lb 4.8 oz (81.784 kg)    I/O last 3 completed shifts: In: 7260.6 [P.O.:2490; I.V.:4770.6] Out: 8575 [Urine:8075; Blood:500]   Results for orders placed during the hospital encounter of 03/07/13 (from the past 48 hour(s))  CBC     Status: Abnormal   Collection Time    03/11/13  8:25 PM      Result Value Range   WBC 8.4  4.0 - 10.5 K/uL   RBC 4.33  3.87 - 5.11 MIL/uL   Hemoglobin 11.4 (*) 12.0 - 15.0 g/dL   HCT 16.1 (*) 09.6 - 04.5 %   MCV 74.8 (*) 78.0 - 100.0 fL   MCH 26.3  26.0 - 34.0 pg   MCHC 35.2  30.0 - 36.0 g/dL   RDW 40.9  81.1 - 91.4 %   Platelets 196  150 - 400 K/uL  URIC ACID     Status: None   Collection Time    03/11/13  8:25 PM      Result Value Range   Uric Acid, Serum 5.2  2.4 - 7.0 mg/dL  COMPREHENSIVE METABOLIC PANEL     Status: Abnormal   Collection Time    03/11/13  8:25 PM      Result Value Range   Sodium 136  135 - 145 mEq/L   Comment: PERFORMED AT [facility]     LIPEMIC SPECIMEN   Potassium 4.2  3.5 - 5.1 mEq/L   Chloride 102  96 - 112 mEq/L   CO2 20  19 - 32 mEq/L   Glucose, Bld 97  70 - 99 mg/dL   BUN 9  6 - 23 mg/dL   Creatinine, Ser 7.82  0.50 - 1.10 mg/dL   Calcium 9.7  8.4 - 95.6 mg/dL   Total Protein 6.1  6.0 - 8.3 g/dL   Albumin 2.5 (*) 3.5 - 5.2 g/dL   AST 16  0 - 37 U/L   ALT 7  0 - 35 U/L   Alkaline Phosphatase 79  39 - 117 U/L   Total  Bilirubin 0.1 (*) 0.3 - 1.2 mg/dL   GFR calc non Af Amer >90  >90 mL/min   GFR calc Af Amer >90  >90 mL/min   Comment:            The eGFR has been calculated     using the CKD EPI equation.     This calculation has not been     validated in all clinical     situations.     eGFR's persistently     <90 mL/min signify     possible Chronic Kidney Disease.  LACTATE DEHYDROGENASE     Status: None   Collection Time    03/11/13  8:25 PM      Result Value Range   LDH 198  94 - 250 U/L  RPR     Status: None   Collection  Time    03/11/13  8:25 PM      Result Value Range   RPR NON REACTIVE  NON REACTIVE  MRSA PCR SCREENING     Status: None   Collection Time    03/12/13  4:55 AM      Result Value Range   MRSA by PCR NEGATIVE  NEGATIVE   Comment:            The GeneXpert MRSA Assay (FDA     approved for NASAL specimens     only), is one component of a     comprehensive MRSA colonization     surveillance program. It is not     intended to diagnose MRSA     infection nor to guide or     monitor treatment for     MRSA infections.  MAGNESIUM     Status: Abnormal   Collection Time    03/12/13  5:35 AM      Result Value Range   Magnesium 4.3 (*) 1.5 - 2.5 mg/dL  CBC     Status: Abnormal   Collection Time    03/12/13  5:37 AM      Result Value Range   WBC 13.6 (*) 4.0 - 10.5 K/uL   RBC 4.01  3.87 - 5.11 MIL/uL   Hemoglobin 10.0 (*) 12.0 - 15.0 g/dL   HCT 14.7 (*) 82.9 - 56.2 %   MCV 73.8 (*) 78.0 - 100.0 fL   MCH 24.9 (*) 26.0 - 34.0 pg   MCHC 33.8  30.0 - 36.0 g/dL   RDW 13.0  86.5 - 78.4 %   Platelets 178  150 - 400 K/uL  COMPREHENSIVE METABOLIC PANEL     Status: Abnormal   Collection Time    03/12/13  5:37 AM      Result Value Range   Sodium 137  135 - 145 mEq/L   Potassium 3.8  3.5 - 5.1 mEq/L   Chloride 103  96 - 112 mEq/L   CO2 23  19 - 32 mEq/L   Glucose, Bld 111 (*) 70 - 99 mg/dL   BUN 8  6 - 23 mg/dL   Creatinine, Ser 6.96  0.50 - 1.10 mg/dL   Calcium 8.1 (*)  8.4 - 10.5 mg/dL   Total Protein 5.2 (*) 6.0 - 8.3 g/dL   Albumin 2.3 (*) 3.5 - 5.2 g/dL   AST 13  0 - 37 U/L   ALT 6  0 - 35 U/L   Alkaline Phosphatase 68  39 - 117 U/L   Total Bilirubin 0.2 (*) 0.3 - 1.2 mg/dL   GFR calc non Af Amer >90  >90 mL/min   GFR calc Af Amer >90  >90 mL/min   Comment:            The eGFR has been calculated     using the CKD EPI equation.     This calculation has not been     validated in all clinical     situations.     eGFR's persistently     <90 mL/min signify     possible Chronic Kidney Disease.  LACTATE DEHYDROGENASE     Status: None   Collection Time    03/12/13  5:37 AM      Result Value Range   LDH 175  94 - 250 U/L  URIC ACID     Status: None   Collection Time    03/12/13  5:37 AM  Result Value Range   Uric Acid, Serum 5.6  2.4 - 7.0 mg/dL    Assessment/Plan: Stable s/p vag delivery Chronic HTN with superimposed preeclampsia-Improved blood pressure  Continue current care. Plan per MD.   LOS: 6 days   Kaiyon Hynes O. 03/13/2013, 9:58 AM

## 2013-03-13 NOTE — Progress Notes (Signed)
UR chart review completed.  

## 2013-03-13 NOTE — Progress Notes (Signed)
Post Partum Day 1 Subjective:  Well. Lochia are normal. Voiding, ambulating, tolerating normal diet. Denies pre-eclampsia symptoms breast pumping going well. Baby doing well in NICU for low birth weight  Objective: Blood pressure 140/98, pulse 81, temperature 97.9 F (36.6 C), temperature source Oral, resp. rate 20, height 5\' 6"  (1.676 m), weight 180 lb 4.8 oz (81.784 kg), last menstrual period 06/05/2012, SpO2 100.00%, unknown if currently breastfeeding.  Physical Exam:  General: normal Lungs: clear Lochia: appropriate Uterine Fundus: 0/1 firm non-tender  Extremities: No evidence of DVT seen on physical exam. Edema 2+ with DTR 2/4 without clonus     Recent Labs  03/11/13 2025 03/12/13 0537  HGB 11.4* 10.0*  HCT 32.4* 29.6*    Assessment/Plan: May transfer to [facility] Continue routine post-partum care. Anticipate discharge tomorrow Will increase Labetalol to 400 mg TID    LOS: 6 days   Ayaansh Smail A MD 03/13/2013, 2:22 PM

## 2013-03-14 MED ORDER — LABETALOL HCL 200 MG PO TABS: 400.0000 mg | ORAL_TABLET | Freq: Three times a day (TID) | ORAL | Status: DC

## 2013-03-14 NOTE — Progress Notes (Signed)
Pt discharged to home with significant other.  Condition stable.  Pt ambulated to car with L. Isidore Moos, NT.  No equipment for home ordered at discharge.  Pt home with rented breast pump from lactation department.

## 2013-03-14 NOTE — Discharge Summary (Addendum)
Obstetric Discharge Summary Reason for Admission: Preeclampsia Prenatal Procedures: NST, Preeclampsia and ultrasound Intrapartum Procedures: spontaneous vaginal delivery Postpartum Procedures: MgSO4 Complications-Operative and Postpartum: Severe Preeclampsia Hemoglobin  Date Value Range Status  03/12/2013 10.0* 12.0 - 15.0 g/dL Final     HCT  Date Value Range Status  03/12/2013 29.6* 36.0 - 46.0 % Final   Subjective: No complaints and denies HA, visual changes or abdominal pain and ready to go home.  Physical Exam:  General: alert and no distress Lochia: appropriate Uterine Fundus: firm DVT Evaluation: No evidence of DVT seen on physical exam.  Discharge Diagnoses: Preelampsia and delivered at 35 + wks stable PP on Labetalol 400mg  TID  Discharge Information: Date: 03/14/2013 Activity: as tolerated Diet: routine Medications: Labetalol 400mg  TID Condition: improved Instructions: refer to practice specific booklet Discharge to: home and f/u in office for BP check in 1wk and smart start nurse to go to house on Monday   Newborn Data:   Lorrin, Bodner [[id number]]  Live born unspecified sex  Birth Weight:  APGAR: ,    Atha, Muradyan Girl Raahi [[id number]]  Live born female  Birth Weight: 4 lb 6 oz (1984 g) APGAR: 9,   Home with baby in NICU doing well .  Purcell Nails 03/14/2013, 12:44 PM

## 2013-03-14 NOTE — Clinical Social Work Psychosocial (Signed)
    Clinical Social Work Department BRIEF PSYCHOSOCIAL ASSESSMENT 03/14/2013  Patient:  Veronica Griffin, Veronica Griffin     Account Number:  [id number]     Admit date:  03/07/2013  Clinical Social Worker:  Almeta Monas  Date/Time:  03/14/2013 11:00 AM  Referred by:  RN  Date Referred:  03/14/2013 Referred for  Other - See comment   Other Referral:   NICU admission   Interview type:  Patient Other interview type:    PSYCHOSOCIAL DATA Living Status:  FAMILY Admitted from facility:   Level of care:   Primary support name:  [name] Primary support relationship to patient:  FRIEND Degree of support available:   MOB reports that she has a good support system.  Numerous family/friends were in and out of her room while we spoke.    CURRENT CONCERNS Current Concerns  None Noted   Other Concerns:    SOCIAL WORK ASSESSMENT / PLAN CSW met with MOB in her third floor room/316 to intoduce myself and complete assessment for NICU admission.  There were family/friends in and out, including a small, which made it somewhat hard for MOB to focus on the conversation, making it a rather brief assessment.  CSW offered to come back at another time, but MOB said it was fine to talk now. CSW informed her of ongoing support services offered by NICU CSW and gave contact information.  CSW gave MOB a pediatrician list and asked her to let medical team know who she has chosen for follow up.  CSW has no social concerns at this time.   Assessment/plan status:  Psychosocial Support/Ongoing Assessment of Needs Other assessment/ plan:   Information/referral to community resources:   No referral needs noted at this time.    PATIENT'S/FAMILY'S RESPONSE TO PLAN OF CARE: MOB was pleasant and appears to be coping very well with baby's admission to NICU.  She understands that baby just needs to eat well and show weight gain in order to be able to go home.  She reports having a good support system and everything she  needs for baby at home.  She thanked CSW for the visit and states no questions or needs at this time.

## 2013-03-14 NOTE — Progress Notes (Signed)
Spoke to Glennon Mac [phone]) at [facility] requesting that a nurse visit patient at home on Mon, June 16 to check blood pressure. Aggie Cosier said that she would arrange for this with the patient's case manager.

## 2013-03-21 ENCOUNTER — Encounter (HOSPITAL_COMMUNITY): Payer: Self-pay | Admitting: *Deleted

## 2014-08-03 ENCOUNTER — Encounter (HOSPITAL_COMMUNITY): Payer: Self-pay | Admitting: *Deleted

## 2014-10-05 ENCOUNTER — Encounter (HOSPITAL_BASED_OUTPATIENT_CLINIC_OR_DEPARTMENT_OTHER): Payer: Self-pay | Admitting: Emergency Medicine

## 2014-10-05 ENCOUNTER — Emergency Department (HOSPITAL_BASED_OUTPATIENT_CLINIC_OR_DEPARTMENT_OTHER)
Admission: EM | Admit: 2014-10-05 | Discharge: 2014-10-05 | Discharge status: Against Medical Advice | Payer: Medicaid Other | Attending: Emergency Medicine | Admitting: Emergency Medicine

## 2014-10-05 DIAGNOSIS — R509 Fever, unspecified: Secondary | ICD-10-CM | POA: Insufficient documentation

## 2014-10-05 DIAGNOSIS — I10 Essential (primary) hypertension: Secondary | ICD-10-CM | POA: Insufficient documentation

## 2014-10-05 DIAGNOSIS — R05 Cough: Secondary | ICD-10-CM | POA: Insufficient documentation

## 2014-10-05 NOTE — ED Notes (Signed)
Fever  Cough head congestion

## 2015-04-08 ENCOUNTER — Encounter: Payer: Self-pay | Admitting: *Deleted

## 2016-03-27 DIAGNOSIS — R87619 Unspecified abnormal cytological findings in specimens from cervix uteri: Secondary | ICD-10-CM | POA: Insufficient documentation

## 2016-03-27 DIAGNOSIS — R87810 Cervical high risk human papillomavirus (HPV) DNA test positive: Secondary | ICD-10-CM | POA: Insufficient documentation

## 2017-08-13 ENCOUNTER — Other Ambulatory Visit: Payer: Self-pay | Admitting: Obstetrics and Gynecology

## 2019-08-20 ENCOUNTER — Other Ambulatory Visit: Payer: Self-pay

## 2019-08-20 DIAGNOSIS — Z20822 Contact with and (suspected) exposure to covid-19: Secondary | ICD-10-CM

## 2019-08-22 LAB — NOVEL CORONAVIRUS, NAA: SARS-CoV-2, NAA: NOT DETECTED

## 2019-12-31 ENCOUNTER — Other Ambulatory Visit: Payer: Self-pay | Admitting: Obstetrics and Gynecology

## 2020-01-28 ENCOUNTER — Ambulatory Visit (INDEPENDENT_AMBULATORY_CARE_PROVIDER_SITE_OTHER)
Admission: EM | Admit: 2020-01-28 | Discharge: 2020-01-28 | Disposition: A | Discharge status: Home / Self Care | Payer: No Typology Code available for payment source | Source: Home / Self Care | Attending: Family Medicine | Admitting: Family Medicine

## 2020-01-28 ENCOUNTER — Encounter (HOSPITAL_BASED_OUTPATIENT_CLINIC_OR_DEPARTMENT_OTHER): Payer: Self-pay | Admitting: *Deleted

## 2020-01-28 ENCOUNTER — Emergency Department (HOSPITAL_BASED_OUTPATIENT_CLINIC_OR_DEPARTMENT_OTHER)
Admission: EM | Admit: 2020-01-28 | Discharge: 2020-01-28 | Disposition: A | Discharge status: Home / Self Care | Payer: No Typology Code available for payment source | Attending: Emergency Medicine | Admitting: Emergency Medicine

## 2020-01-28 ENCOUNTER — Encounter (HOSPITAL_COMMUNITY): Payer: Self-pay

## 2020-01-28 ENCOUNTER — Other Ambulatory Visit: Payer: Self-pay

## 2020-01-28 ENCOUNTER — Emergency Department (HOSPITAL_BASED_OUTPATIENT_CLINIC_OR_DEPARTMENT_OTHER): Payer: No Typology Code available for payment source

## 2020-01-28 DIAGNOSIS — J020 Streptococcal pharyngitis: Secondary | ICD-10-CM

## 2020-01-28 DIAGNOSIS — I10 Essential (primary) hypertension: Secondary | ICD-10-CM | POA: Insufficient documentation

## 2020-01-28 DIAGNOSIS — J029 Acute pharyngitis, unspecified: Secondary | ICD-10-CM | POA: Insufficient documentation

## 2020-01-28 DIAGNOSIS — Z87891 Personal history of nicotine dependence: Secondary | ICD-10-CM | POA: Diagnosis not present

## 2020-01-28 DIAGNOSIS — R591 Generalized enlarged lymph nodes: Secondary | ICD-10-CM | POA: Diagnosis not present

## 2020-01-28 DIAGNOSIS — Z79899 Other long term (current) drug therapy: Secondary | ICD-10-CM | POA: Insufficient documentation

## 2020-01-28 LAB — CBC WITH DIFFERENTIAL/PLATELET
Abs Immature Granulocytes: 0.04 10*3/uL (ref 0.00–0.07)
Basophils Absolute: 0.1 10*3/uL (ref 0.0–0.1)
Basophils Relative: 1 %
Eosinophils Absolute: 0.4 10*3/uL (ref 0.0–0.5)
Eosinophils Relative: 4 %
HCT: 41.4 % (ref 36.0–46.0)
Hemoglobin: 13.8 g/dL (ref 12.0–15.0)
Immature Granulocytes: 0 %
Lymphocytes Relative: 22 %
Lymphs Abs: 2.2 10*3/uL (ref 0.7–4.0)
MCH: 25 pg — ABNORMAL LOW (ref 26.0–34.0)
MCHC: 33.3 g/dL (ref 30.0–36.0)
MCV: 74.9 fL — ABNORMAL LOW (ref 80.0–100.0)
Monocytes Absolute: 0.7 10*3/uL (ref 0.1–1.0)
Monocytes Relative: 6 %
Neutro Abs: 6.8 10*3/uL (ref 1.7–7.7)
Neutrophils Relative %: 67 %
Platelets: 394 10*3/uL (ref 150–400)
RBC: 5.53 MIL/uL — ABNORMAL HIGH (ref 3.87–5.11)
RDW: 13.7 % (ref 11.5–15.5)
WBC: 10.1 10*3/uL (ref 4.0–10.5)
nRBC: 0 % (ref 0.0–0.2)

## 2020-01-28 LAB — COMPREHENSIVE METABOLIC PANEL
ALT: 40 U/L (ref 0–44)
AST: 23 U/L (ref 15–41)
Albumin: 4.4 g/dL (ref 3.5–5.0)
Alkaline Phosphatase: 48 U/L (ref 38–126)
Anion gap: 10 (ref 5–15)
BUN: 17 mg/dL (ref 6–20)
CO2: 24 mmol/L (ref 22–32)
Calcium: 9.1 mg/dL (ref 8.9–10.3)
Chloride: 102 mmol/L (ref 98–111)
Creatinine, Ser: 0.74 mg/dL (ref 0.44–1.00)
GFR calc Af Amer: >60 mL/min (ref >60)
GFR calc non Af Amer: >60 mL/min (ref >60)
Glucose, Bld: 100 mg/dL — ABNORMAL HIGH (ref 70–99)
Potassium: 3.9 mmol/L (ref 3.5–5.1)
Sodium: 136 mmol/L (ref 135–145)
Total Bilirubin: 0.3 mg/dL (ref 0.3–1.2)
Total Protein: 8.8 g/dL — ABNORMAL HIGH (ref 6.5–8.1)

## 2020-01-28 MED ORDER — IOHEXOL 300 MG/ML  SOLN
100.0000 mL | Freq: Once | INTRAMUSCULAR | Status: AC | PRN
  Administered 2020-01-28: 75 mL via INTRAVENOUS

## 2020-01-28 MED ORDER — DEXAMETHASONE SODIUM PHOSPHATE 10 MG/ML IJ SOLN
8.0000 mg | Freq: Once | INTRAMUSCULAR | Status: AC
  Administered 2020-01-28: 8 mg via INTRAVENOUS
  Filled 2020-01-28: qty 1

## 2020-01-28 MED ORDER — KETOROLAC TROMETHAMINE 30 MG/ML IJ SOLN
30.0000 mg | Freq: Once | INTRAMUSCULAR | Status: AC
  Administered 2020-01-28: 30 mg via INTRAVENOUS
  Filled 2020-01-28: qty 1

## 2020-01-28 MED ORDER — AMOXICILLIN-POT CLAVULANATE 875-125 MG PO TABS: 1.0000 | ORAL_TABLET | Freq: Two times a day (BID) | ORAL | 0 refills | Status: DC

## 2020-01-28 MED ORDER — HYDROCODONE-ACETAMINOPHEN 5-325 MG PO TABS: 1.0000 | ORAL_TABLET | Freq: Four times a day (QID) | ORAL | 0 refills | Status: DC | PRN

## 2020-01-28 NOTE — Discharge Instructions (Addendum)
Please take antibiotics as prescribed.  Please use 600-800 mg ibuprofen every 6 hours as we discussed.  You may use the pain medication that you are prescribed as needed for pain as prescribed by the urgent care doctor.  Given a strong anti-inflammatory medication in the IV today.  This should help decrease the swelling.  Please return to ED if you have any new or concerning symptoms such as those that we discussed specifically difficulty breathing.

## 2020-01-28 NOTE — ED Triage Notes (Signed)
Pt c/o left sided sore throat. Seen at [facility] today started ABX w/o improvement

## 2020-01-28 NOTE — ED Provider Notes (Signed)
[facility] EMERGENCY DEPARTMENT Provider Note   CSN: [id number] Arrival date & time: 01/28/20  1857     History Chief Complaint  Patient presents with  . Sore Throat    Veronica Griffin is a 29 y.o. female.  HPI  Presents today with left-sided sore throat for the past 3 days.  She states that it is painful to swallow.  Feels like razor blades.  States that she was seen in the urgent care earlier today and started on antibiotics.  She states she did not have a strep test she states that she has had only 1 dose of antibiotics so far states that she was sent from urgent care to ED for CT scan to rule out peritonsillar abscess.  When I have assessed her she had already had CT scan done as it was ordered in triage by MD.  Patient states she is able to manage her secretions is not drooling has not been difficulty breathing.  She states that when she laid down after she took the first dose of Augmentin she felt like her throat was "closing "but states difficulty breathing at that time no difficulty swallowing.  She has no rash, no abdominal diarrhea, headache, lightheadedness, wheezing or shortness of breath.  She has no history of allergic reactions or anaphylaxis.     Past Medical History:  Diagnosis Date  . Hypertension    Taking Aldomet 250mg  TID  . Palpitations     Patient Active Problem List   Diagnosis Date Noted  . Vaginal delivery 03/12/2013  . First-degree perineal laceration, with delivery 03/12/2013  . Pre-eclampsia added to pre-existing hypertension 03/07/2013  . Hgb E 03/07/2013  . Chronic hypertension in pregnancy 10/10/2012    Past Surgical History:  Procedure Laterality Date  . NO PAST SURGERIES       OB History    Gravida  2   Para  1   Term  1   Preterm  0   AB  1   Living  1     SAB      TAB      Ectopic      Multiple  1   Live Births  1           Family History  Problem Relation Age of Onset  . Hypertension Mother    . Hypertension Father   . Hypertension Maternal Aunt     Social History   Tobacco Use  . Smoking status: Former Smoker    Types: Cigarettes    Quit date: 12/01/2011    Years since quitting: 8.1  . Smokeless tobacco: Never Used  Substance Use Topics  . Alcohol use: No    Comment: Occasionally before +UPT  . Drug use: No    Home Medications Prior to Admission medications   Medication Sig Start Date End Date Taking? Authorizing Provider  amoxicillin-clavulanate (AUGMENTIN) 875-125 MG tablet Take 1 tablet by mouth every 12 (twelve) hours. 01/28/20   01/30/20, MD  HYDROcodone-acetaminophen (NORCO/VICODIN) 5-325 MG tablet Take 1 tablet by mouth every 6 (six) hours as needed for moderate pain or severe pain. 01/28/20   01/30/20, MD  labetalol (NORMODYNE) 200 MG tablet Take 2 tablets (400 mg total) by mouth 3 (three) times daily. 03/14/13   03/16/13, MD  valACYclovir (VALTREX) 1000 MG tablet Take 2,000 mg by mouth 2 (two) times daily. 12/29/19   [provider]    Allergies    Patient has no known  allergies.  Review of Systems   Review of Systems  Constitutional: Positive for chills. Negative for fever.  HENT: Positive for sore throat. Negative for congestion.   Eyes: Negative for pain.  Respiratory: Negative for cough and shortness of breath.   Cardiovascular: Negative for chest pain and leg swelling.  Gastrointestinal: Negative for abdominal pain and vomiting.  Genitourinary: Negative for dysuria.  Musculoskeletal: Negative for myalgias.  Skin: Negative for rash.  Neurological: Negative for dizziness and headaches.    Physical Exam Updated Vital Signs BP (!) 145/78   Pulse 78   Temp 98.8 F (37.1 C) (Oral)   Resp 18   Ht 5\' 7"  (1.702 m)   Wt 74.8 kg   LMP 12/22/2019   SpO2 100%   BMI 25.84 kg/m   Physical Exam Vitals and nursing note reviewed.  Constitutional:      General: She is not in acute distress.    Appearance: Normal appearance.  She is not ill-appearing.  HENT:     Head: Normocephalic and atraumatic.     Nose: No congestion or rhinorrhea.     Mouth/Throat:     Mouth: Mucous membranes are moist.     Comments: Posterior pharyngeal erythema.  Scant tonsillar exudates ; mild 1+ bilateral symmetric tonsillar hypertrophy. Eyes:     General: No scleral icterus.       Right eye: No discharge.        Left eye: No discharge.     Conjunctiva/sclera: Conjunctivae normal.  Neck:     Comments: Anterior cervical lymphadenopathy right worse than left. Pulmonary:     Effort: Pulmonary effort is normal.     Breath sounds: No stridor.  Abdominal:     Palpations: Abdomen is soft.     Tenderness: There is no abdominal tenderness.  Musculoskeletal:     Cervical back: Normal range of motion and neck supple. Tenderness present.  Skin:    General: Skin is warm and dry.     Comments: No rash  Neurological:     Mental Status: She is alert and oriented to person, place, and time. Mental status is at baseline.  Psychiatric:        Mood and Affect: Mood normal.        Behavior: Behavior normal.     ED Results / Procedures / Treatments   Labs (all labs ordered are listed, but only abnormal results are displayed) Labs Reviewed  CBC WITH DIFFERENTIAL/PLATELET - Abnormal; Notable for the following components:      Result Value   RBC 5.53 (*)    MCV 74.9 (*)    MCH 25.0 (*)    All other components within normal limits  COMPREHENSIVE METABOLIC PANEL - Abnormal; Notable for the following components:   Glucose, Bld 100 (*)    Total Protein 8.8 (*)    All other components within normal limits    EKG None  Radiology CT Soft Tissue Neck W Contrast  Result Date: 01/28/2020 CLINICAL DATA:  Initial evaluation for acute neck pain, epiglottitis or tonsillitis suspected. EXAM: CT NECK WITH CONTRAST TECHNIQUE: Multidetector CT imaging of the neck was performed using the standard protocol following the bolus administration of  intravenous contrast. CONTRAST:  52mL OMNIPAQUE IOHEXOL 300 MG/ML  SOLN COMPARISON:  None available. FINDINGS: Pharynx and larynx: Oral cavity within normal limits without discrete mass or collection. No acute abnormality about the dentition. Palatine tonsils are mildly prominent and hypertrophied bilaterally, suggesting possible acute tonsillitis given provided history. No discrete  tonsillar or peritonsillar abscess. Parapharyngeal fat maintained. Adenoidal soft tissues prominent as well. Remainder of the oropharynx and nasopharynx otherwise within normal limits. No retropharyngeal collection. Epiglottis within normal limits. Vallecula partially effaced by the lingual tonsils on the left. Remainder of the hypopharynx and supraglottic larynx within normal limits. True cords symmetric and normal. Subglottic airway clear. Salivary glands: Salivary glands including the parotid and submandibular glands are within normal limits. Thyroid: Normal. Lymph nodes: Mildly prominent left level II lymph nodes measure up to 11 mm, likely reactive. No other enlarged or pathologic adenopathy within the neck. Vascular: Normal intravascular enhancement seen throughout the neck. Limited intracranial: Unremarkable. Visualized orbits: Globes and orbital soft tissues within normal limits. Mastoids and visualized paranasal sinuses: Small air-fluid level noted within the right maxillary sinus, consistent with acute sinusitis. Visualized paranasal sinuses are otherwise clear. Mastoid air cells and middle ear cavities are well pneumatized and free of fluid. Skeleton: No acute osseous finding. No discrete or worrisome osseous lesions. Upper chest: Minimal hazy ground-glass opacity along the right major fissure favored to reflect atelectatic changes. Partially visualized lungs are otherwise clear. Visualized upper chest demonstrates no other acute finding. Other: None. IMPRESSION: 1. Mild prominence of the palatine tonsils bilaterally,  suggesting possible acute tonsillitis given provided history. No discrete tonsillar or peritonsillar abscess. 2. Mildly prominent left level II lymph nodes, likely reactive. 3. Small air-fluid level within the right maxillary sinus, consistent with acute sinusitis. Electronically Signed   By: Rise Mu M.D.   On: 01/28/2020 21:31    Procedures Procedures (including critical care time)  Medications Ordered in ED Medications  iohexol (OMNIPAQUE) 300 MG/ML solution 100 mL (75 mLs Intravenous Contrast Given 01/28/20 2106)  dexamethasone (DECADRON) injection 8 mg (8 mg Intravenous Given 01/28/20 2303)  ketorolac (TORADOL) 30 MG/ML injection 30 mg (30 mg Intravenous Given 01/28/20 2303)    ED Course  I have reviewed the triage vital signs and the nursing notes.  Pertinent labs & imaging results that were available during my care of the patient were reviewed by me and considered in my medical decision making (see chart for details).    MDM Rules/Calculators/A&P                      Patient is well-appearing 29 year old female with no past medical history presented today for sore throat.  She was in urgent care earlier today and was discharged with Augmentin and Percocet.  Physical exam notable for cervical lymphadenopathy, left tonsillar hypertrophy as well as milder right tonsillar hypertrophy.  Scant exudates.  On my examination I have low suspicion for peritonsillar abscess she appears to have uncomplicated pharyngitis.  She is already started on antibiotics and I think it is reasonable for her to finish her antibiotics.  I discussed with patient the pros and cons of having a strep swab done she would prefer not to have it done at this time.  She is already had a CT scan of the neck done which showed no RTA/PTA but does corroborate my physical exam finding of tonsillitis/pharyngitis.  CMP without electrolyte abnormality.  CBC with no anemia or leukocytosis.  Patient given 1 dose of  Decadron and Toradol for pain and inflammation.  She was given strict return precautions.  To return to ED for any new or concerning symptoms.  Doubt anaphylaxis or allergic reaction.  Suspect that this is all secondary to her sore throat.  Final Clinical Impression(s) / ED Diagnoses Final diagnoses:  Strep throat  Rx / DC Orders ED Discharge Orders    None       Gailen Shelter, [place] 01/29/20 3300    Vanetta Mulders, MD 01/31/20 [phone]

## 2020-01-28 NOTE — ED Triage Notes (Signed)
Pt is here with neck pain that started yesterday and a lump on her actual neck that started a week ago.

## 2020-01-28 NOTE — ED Provider Notes (Signed)
[facility]   [id number] 01/28/20 Arrival Time: 6384  ASSESSMENT & PLAN:  1. Lymphadenopathy of head and neck   2. Sore throat     No obvious signs of peritonsillar abscess at this time but will cover with antibiotic.Marland Kitchen Discussed.  Meds ordered this encounter  Medications  . amoxicillin-clavulanate (AUGMENTIN) 875-125 MG tablet    Sig: Take 1 tablet by mouth every 12 (twelve) hours.    Dispense:  20 tablet    Refill:  0  . HYDROcodone-acetaminophen (NORCO/VICODIN) 5-325 MG tablet    Sig: Take 1 tablet by mouth every 6 (six) hours as needed for moderate pain or severe pain.    Dispense:  8 tablet    Refill:  0    OTC ibuprofen and throat care as needed    Discharge Instructions      You may use over the counter ibuprofen or acetaminophen as needed.   For a sore throat, over the counter products such as [employer]gate Peroxyl Mouth Sore Rinse or Chloraseptic Sore Throat Spray may provide some temporary relief. Be aware, you have been prescribed pain medications that may cause drowsiness. While taking this medication, do not take any other medications containing acetaminophen (Tylenol). Do not combine with alcohol or other illicit drugs. Please do not drive, operate heavy machinery, or take part in activities that require making important decisions while on this medication as your judgement may be clouded.    Reviewed expectations re: course of current medical issues. Questions answered. Outlined signs and symptoms indicating need for more acute intervention. Patient verbalized understanding. After Visit Summary given.   SUBJECTIVE:  Veronica Griffin is a 29 y.o. female who reports a sore throat. Describes as pain with swallowing. Onset gradual beginning a week ago. "Got better but noticed yesterday and last night much worse". Now reports swollen lymph node on left. Much more L-sided throat pain with swallowing. No voiced changes. No respiratory symptoms. Decreased PO  intake secondary to throat pain. No specific alleviating factors. Fever: absent. No associated nausea, vomiting, or abdominal pain. Known sick contacts: none. Recent travel: none. OTC treatment: Tylenol with some relief.    OBJECTIVE:  Vitals:   01/28/20 1014 01/28/20 1016  BP:  140/80  Pulse:  74  Resp:  16  Temp:  98.9 F (37.2 C)  TempSrc:  Oral  SpO2:  98%  Weight: 74.8 kg      General appearance: alert; no distress HEENT: throat with moderate erythema; uvula is midline; L tonsil does appear slightly enlarged compared with R Neck: supple with FROM; approx 1-1.5 cm node, L cervical that is TTP Lungs: speaks full sentences without difficulty Abd: soft; non-tender Skin: reveals no rash; warm and dry Psychological: alert and cooperative; normal mood and affect  No Known Allergies  Past Medical History:  Diagnosis Date  . Hypertension    Taking Aldomet 250mg  TID  . Palpitations    Social History   Socioeconomic History  . Marital status: Single    Spouse name: Not on file  . Number of children: Not on file  . Years of education: 13.5  . Highest education level: Not on file  Occupational History  . Occupation: : [employer]  Tobacco Use  . Smoking status: Former Smoker    Types: Cigarettes    Quit date: 12/01/2011    Years since quitting: 8.1  . Smokeless tobacco: Never Used  Substance and Sexual Activity  . Alcohol use: No  Comment: Occasionally before +UPT  . Drug use: No  . Sexual activity: Yes    Partners: Male    Birth control/protection: None  Other Topics Concern  . Not on file  Social History Narrative  . Not on file   Social Determinants of Health   Financial Resource Strain:   . Difficulty of Paying Living Expenses:   Food Insecurity:   . Worried About Charity fundraiser in the Last Year:   . Arboriculturist in the Last Year:   Transportation Needs:   . Film/video editor (Medical):   Marland Kitchen Lack of  Transportation (Non-Medical):   Physical Activity:   . Days of Exercise per Week:   . Minutes of Exercise per Session:   Stress:   . Feeling of Stress :   Social Connections:   . Frequency of Communication with Friends and Family:   . Frequency of Social Gatherings with Friends and Family:   . Attends Religious Services:   . Active Member of Clubs or Organizations:   . Attends Archivist Meetings:   Marland Kitchen Marital Status:   Intimate Partner Violence:   . Fear of Current or Ex-Partner:   . Emotionally Abused:   Marland Kitchen Physically Abused:   . Sexually Abused:    Family History  Problem Relation Age of Onset  . Hypertension Mother   . Hypertension Father   . Hypertension Maternal Herbert Moors, MD 01/28/20 1058

## 2020-01-28 NOTE — Discharge Instructions (Addendum)
You may use over the counter ibuprofen or acetaminophen as needed.  For a sore throat, over the counter products such as Colgate Peroxyl Mouth Sore Rinse or Chloraseptic Sore Throat Spray may provide some temporary relief. Be aware, you have been prescribed pain medications that may cause drowsiness. While taking this medication, do not take any other medications containing acetaminophen (Tylenol). Do not combine with alcohol or other illicit drugs. Please do not drive, operate heavy machinery, or take part in activities that require making important decisions while on this medication as your judgement may be clouded.

## 2020-02-24 ENCOUNTER — Other Ambulatory Visit: Payer: Self-pay | Admitting: Obstetrics and Gynecology

## 2020-03-13 ENCOUNTER — Emergency Department (HOSPITAL_COMMUNITY)
Admission: EM | Admit: 2020-03-13 | Discharge: 2020-03-13 | Disposition: A | Discharge status: Home / Self Care | Payer: No Typology Code available for payment source | Attending: Emergency Medicine | Admitting: Emergency Medicine

## 2020-03-13 ENCOUNTER — Other Ambulatory Visit: Payer: Self-pay

## 2020-03-13 DIAGNOSIS — Z87891 Personal history of nicotine dependence: Secondary | ICD-10-CM | POA: Insufficient documentation

## 2020-03-13 DIAGNOSIS — Z79899 Other long term (current) drug therapy: Secondary | ICD-10-CM | POA: Insufficient documentation

## 2020-03-13 DIAGNOSIS — I10 Essential (primary) hypertension: Secondary | ICD-10-CM | POA: Diagnosis not present

## 2020-03-13 DIAGNOSIS — R519 Headache, unspecified: Secondary | ICD-10-CM | POA: Diagnosis present

## 2020-03-13 MED ORDER — GABAPENTIN 300 MG PO CAPS: 300.0000 mg | ORAL_CAPSULE | Freq: Three times a day (TID) | ORAL | 0 refills | Status: DC

## 2020-03-13 MED ORDER — ATENOLOL 25 MG PO TABS: 25.0000 mg | ORAL_TABLET | Freq: Every day | ORAL | 1 refills | Status: DC

## 2020-03-13 NOTE — ED Provider Notes (Signed)
[facility] DEPT Provider Note   CSN: [id number] Arrival date & time: 03/13/20  1454     History Chief Complaint  Patient presents with  . Headache  . Hypertension    Veronica Griffin is a 29 y.o. female.  HPI   28yF with HTN. Hx of the same. Has been on meds previously but not in about a year. Ran out and hasn't followed up. Has been having intermittent HA for the past week. Describes brief sharp pain in the R side of her head. Episodes last 1-2 seconds but can be very severe. Happen w/o any appreciable exacerbating or relieving factors. No lacrimation/rhinorrhea. No visual changes. Scalp isn't tender. Doesn't have a significant headache history. Works on the fourth floor. They checked her BP and then advised she come to the ED after noting pressure of 174/124.  Past Medical History:  Diagnosis Date  . Hypertension    Taking Aldomet 250mg  TID  . Palpitations     Patient Active Problem List   Diagnosis Date Noted  . Vaginal delivery 03/12/2013  . First-degree perineal laceration, with delivery 03/12/2013  . Pre-eclampsia added to pre-existing hypertension 03/07/2013  . Hgb E 03/07/2013  . Chronic hypertension in pregnancy 10/10/2012    Past Surgical History:  Procedure Laterality Date  . NO PAST SURGERIES       OB History    Gravida  2   Para  1   Term  1   Preterm  0   AB  1   Living  1     SAB      TAB      Ectopic      Multiple  1   Live Births  1           Family History  Problem Relation Age of Onset  . Hypertension Mother   . Hypertension Father   . Hypertension Maternal Aunt     Social History   Tobacco Use  . Smoking status: Former Smoker    Types: Cigarettes    Quit date: 12/01/2011    Years since quitting: 8.2  . Smokeless tobacco: Never Used  Substance Use Topics  . Alcohol use: No    Comment: Occasionally before +UPT  . Drug use: No    Home Medications Prior to Admission medications    Medication Sig Start Date End Date Taking? Authorizing Provider  amoxicillin-clavulanate (AUGMENTIN) 875-125 MG tablet Take 1 tablet by mouth every 12 (twelve) hours. 01/28/20   Vanessa Kick, MD  atenolol (TENORMIN) 25 MG tablet Take 1 tablet (25 mg total) by mouth daily. 03/13/20   Virgel Manifold, MD  gabapentin (NEURONTIN) 300 MG capsule Take 1 capsule (300 mg total) by mouth 3 (three) times daily. 03/13/20   Virgel Manifold, MD  HYDROcodone-acetaminophen (NORCO/VICODIN) 5-325 MG tablet Take 1 tablet by mouth every 6 (six) hours as needed for moderate pain or severe pain. 01/28/20   Vanessa Kick, MD  labetalol (NORMODYNE) 200 MG tablet Take 2 tablets (400 mg total) by mouth 3 (three) times daily. 03/14/13   Everett Graff, MD  valACYclovir (VALTREX) 1000 MG tablet Take 2,000 mg by mouth 2 (two) times daily. 12/29/19   [provider]    Allergies    Patient has no known allergies.  Review of Systems   Review of Systems All systems reviewed and negative, other than as noted in HPI.  Physical Exam Updated Vital Signs BP (!) 178/121 (BP Location: Right Arm)   Pulse  85   Temp 98.4 F (36.9 C) (Oral)   Resp 16   Ht 5\' 7"  (1.702 m)   Wt 72.6 kg   LMP 03/10/2020   SpO2 100%   BMI 25.06 kg/m   Physical Exam Vitals and nursing note reviewed.  Constitutional:      General: She is not in acute distress.    Appearance: She is well-developed.  HENT:     Head: Normocephalic and atraumatic.      Comments: Reports pain in pictured area. Scalp NT. Thick hair but I didn't notice any obvious scalp rash. Neck supple.  Eyes:     General:        Right eye: No discharge.        Left eye: No discharge.     Conjunctiva/sclera: Conjunctivae normal.  Cardiovascular:     Rate and Rhythm: Normal rate and regular rhythm.     Heart sounds: Normal heart sounds. No murmur heard.  No friction rub. No gallop.   Pulmonary:     Effort: Pulmonary effort is normal. No respiratory distress.      Breath sounds: Normal breath sounds.  Abdominal:     General: There is no distension.     Palpations: Abdomen is soft.     Tenderness: There is no abdominal tenderness.  Musculoskeletal:        General: No tenderness.     Cervical back: Neck supple. No rigidity or tenderness.  Skin:    General: Skin is warm and dry.  Neurological:     General: No focal deficit present.     Mental Status: She is alert.     Cranial Nerves: No cranial nerve deficit.     Sensory: No sensory deficit.     Motor: No weakness.  Psychiatric:        Behavior: Behavior normal.        Thought Content: Thought content normal.     ED Results / Procedures / Treatments   Labs (all labs ordered are listed, but only abnormal results are displayed) Labs Reviewed - No data to display  EKG None  Radiology No results found.  Procedures Procedures (including critical care time)  Medications Ordered in ED Medications - No data to display  ED Course  I have reviewed the triage vital signs and the nursing notes.  Pertinent labs & imaging results that were available during my care of the patient were reviewed by me and considered in my medical decision making (see chart for details).    MDM Rules/Calculators/A&P                         28yF with HA and noted to be hypertensive. Hx of the same and non-compliant with meds. Thinks she was on atenolol previously. Prescription provided. Discussed the need for follow-up. I don't think her HA is actually related to the hypertension though. Extremely brief and sharp pain sounds neuropathic to me. Seems high for trigeminal neuralgia. Location and lack of other symptoms doesn't sound like cluster headaches. Regardless, I doubt emergent condition such as bleed, infectious, CO, etc. Can try gabapentin if symptoms persist. Outpt FU otherwise. Declined work note. Activity as tolerated.   Final Clinical Impression(s) / ED Diagnoses Final diagnoses:  Hypertension,  unspecified type  Nonintractable episodic headache, unspecified headache type    Rx / DC Orders ED Discharge Orders         Ordered    atenolol (TENORMIN) 25 MG  tablet  Daily     Discontinue  Reprint     03/13/20 1517    gabapentin (NEURONTIN) 300 MG capsule  3 times daily     Discontinue  Reprint     03/13/20 1518           Raeford Razor, MD 03/13/20 1807

## 2020-03-13 NOTE — ED Triage Notes (Addendum)
Per patient, she is employee at [employer], works on [employer]. Patient has had headache all week and charge nurse checked patient's bp today. Patient's bp was 174/124 - patient walked to ED from fourth floor. Patient states she has history of hbp. Was taking atenolol but stopped taking it over a year ago because she didn't have a refill. Patient says he has intermittent pain in her head that feels like a stabbing pain. Denies pain at present moment.

## 2021-01-18 ENCOUNTER — Other Ambulatory Visit: Payer: Self-pay

## 2021-01-18 ENCOUNTER — Other Ambulatory Visit: Payer: Self-pay | Admitting: Orthopedic Surgery

## 2021-01-18 ENCOUNTER — Ambulatory Visit (HOSPITAL_COMMUNITY)
Admission: RE | Admit: 2021-01-18 | Discharge: 2021-01-18 | Disposition: A | Discharge status: Home / Self Care | Payer: No Typology Code available for payment source | Source: Ambulatory Visit | Attending: Orthopedic Surgery | Admitting: Orthopedic Surgery

## 2021-01-18 DIAGNOSIS — M25522 Pain in left elbow: Secondary | ICD-10-CM

## 2021-01-18 NOTE — Progress Notes (Signed)
Asked to see pt for elbow s/p fall on Sunday. Mod-severe TTP medial/lat epiconcyle, milder TTP radial head. NT olecranon. Will order x-rays.   Freeman Caldron, PA-C Orthopedic Surgery [phone]

## 2021-03-25 IMAGING — CT CT NECK W/ CM
3 of 4 series · 13 of 33 positions shown, 16 images · IV contrast (omnipaque)
Comparison: None available.

CLINICAL DATA: Initial evaluation for acute neck pain, epiglottitis
or tonsillitis suspected.

EXAM:
CT NECK WITH CONTRAST
TECHNIQUE: Multidetector CT imaging of the neck was performed using the
standard protocol following the bolus administration of intravenous
contrast.
CONTRAST:  75mL OMNIPAQUE IOHEXOL 300 MG/ML  SOLN

[Series 3: axial neck · axial · 0.53mm/px · z∈[-416,-232]mm · 5 of 138 slices shown, 7 images]
[im 23/138  soft-tissue]
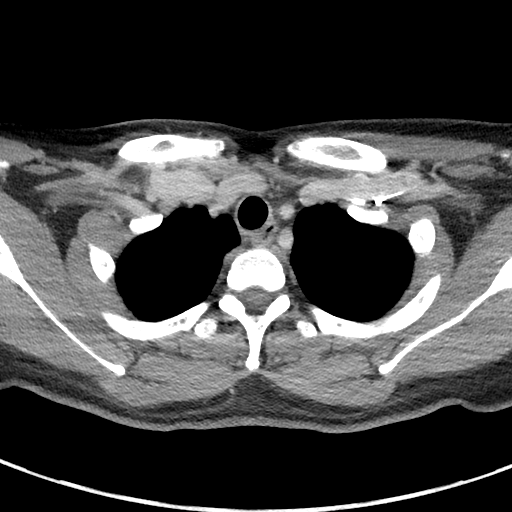
[im 23/138  bone]
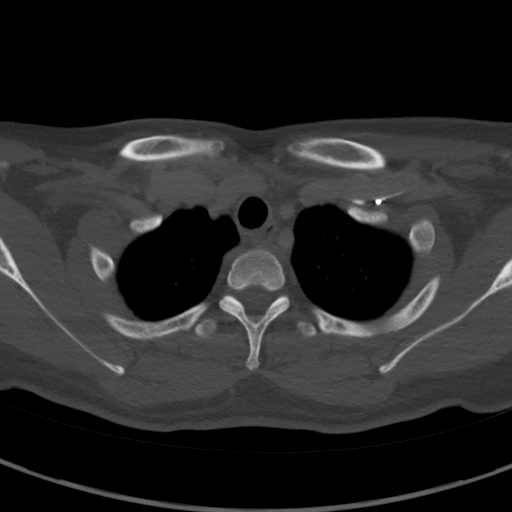
[im 46/138  bone]
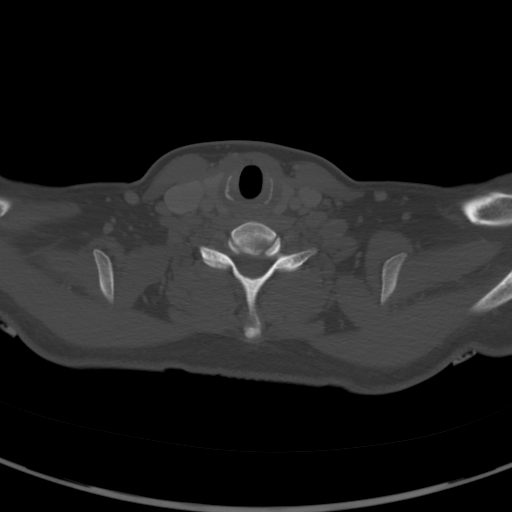
[im 69/138  bone]
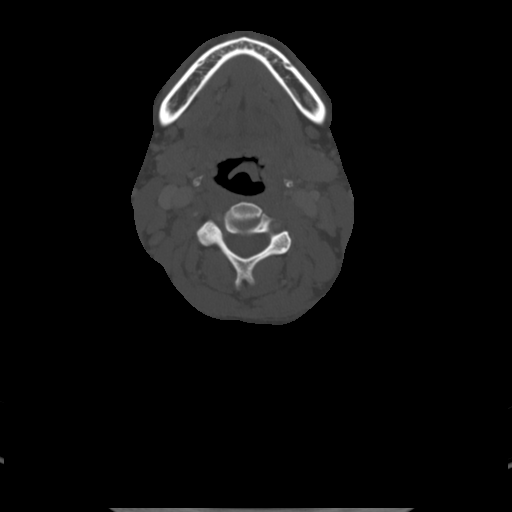
[im 92/138  bone]
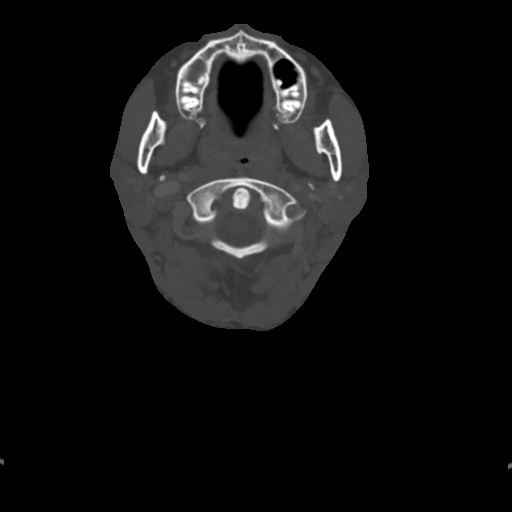
[im 115/138  soft-tissue]
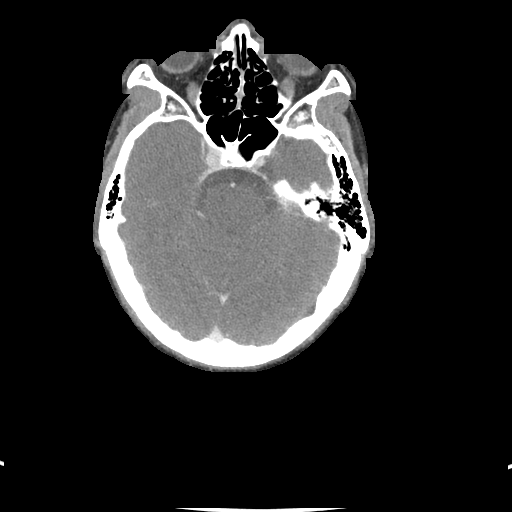
[im 115/138  bone]
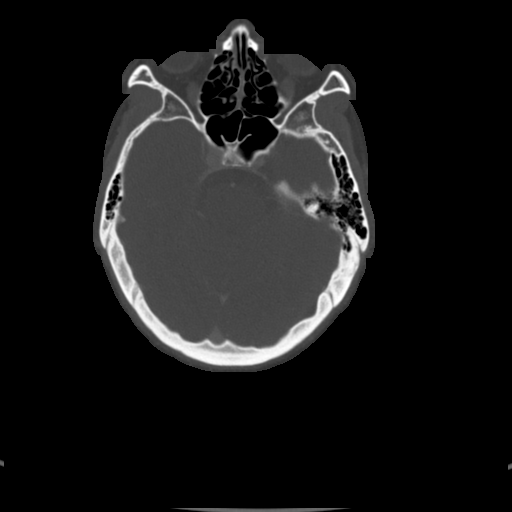

[Series 4: sag neck · sagittal · 0.49mm/px · 5 of 101 slices shown, 6 images]
[im 34/101  bone]
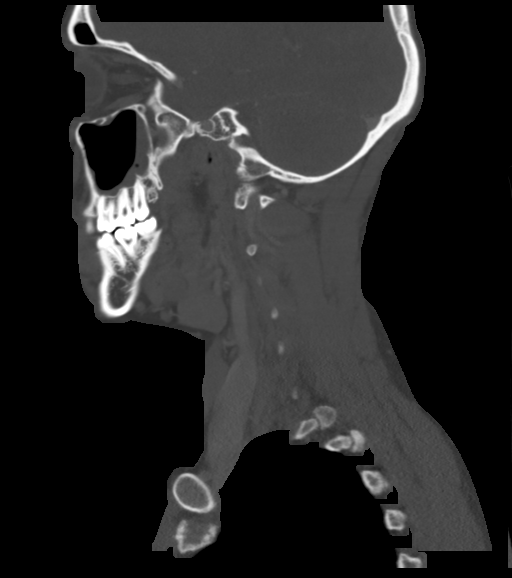
[im 42/101  bone]
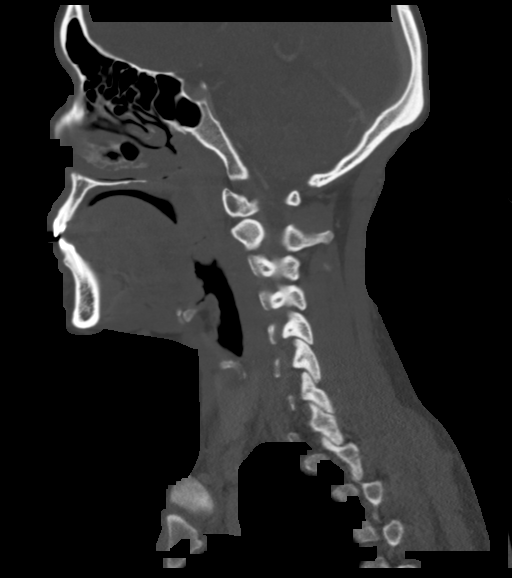
[im 51/101  soft-tissue]
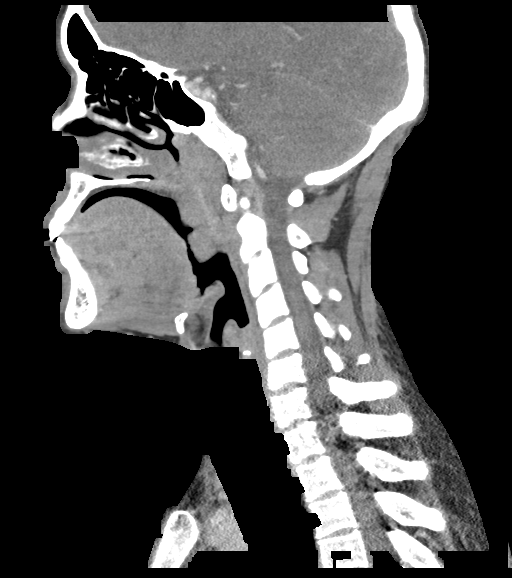
[im 51/101  bone]
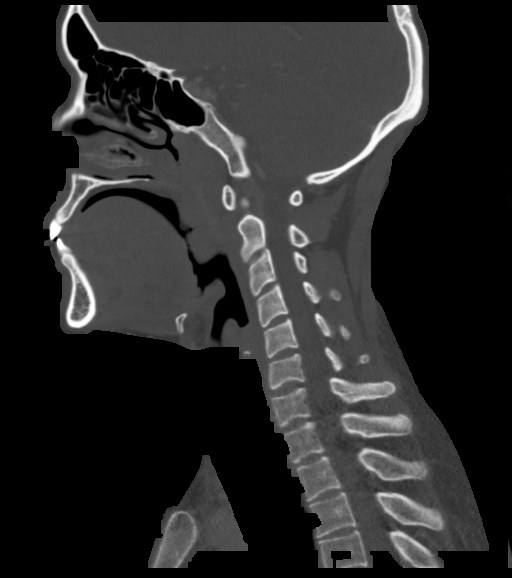
[im 59/101  bone]
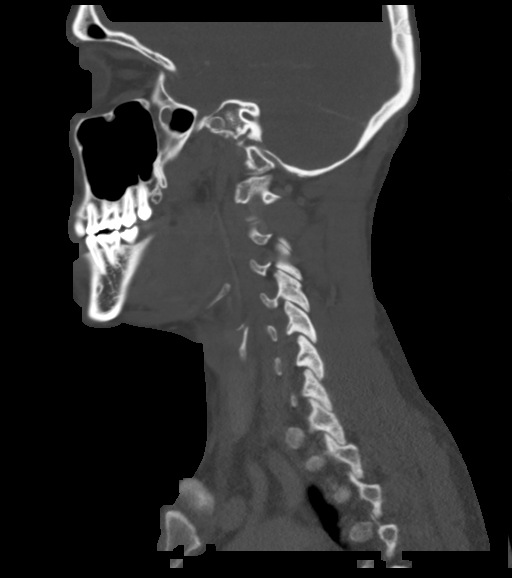
[im 67/101  bone]
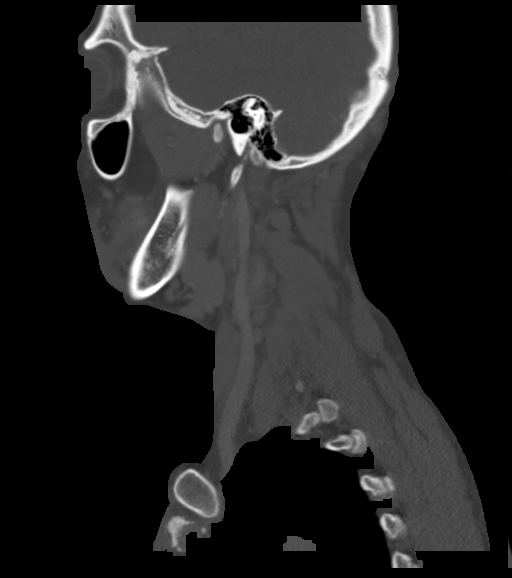

[Series 5: cor neck · coronal · 0.57mm/px · 3 of 118 slices shown]
[im 24/118  bone]
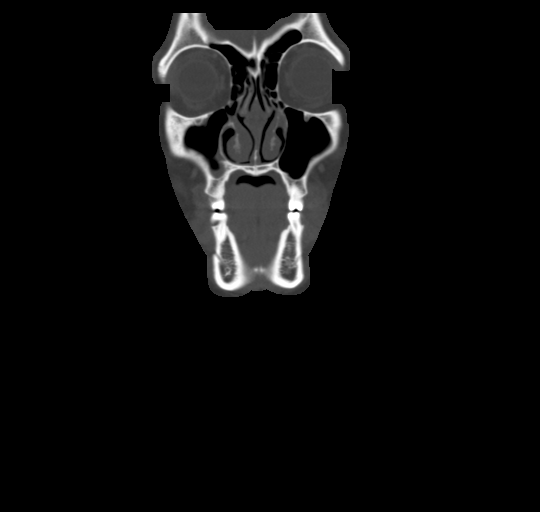
[im 47/118  bone]
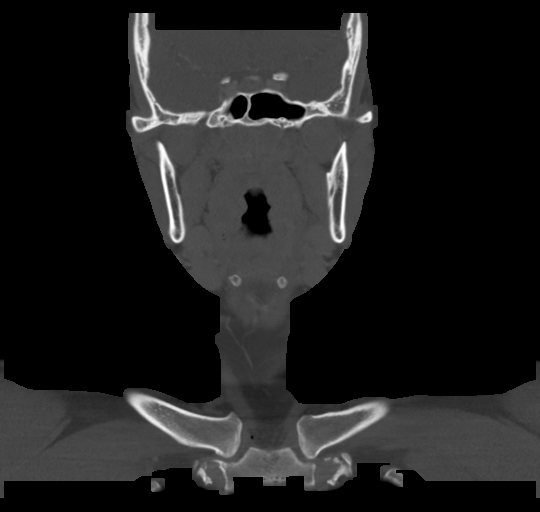
[im 71/118  bone]
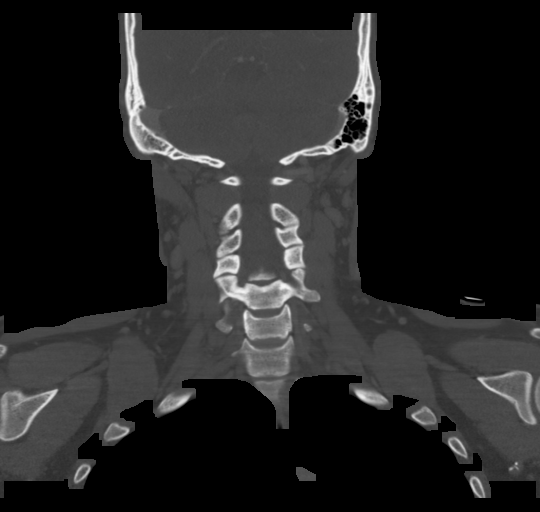

[13 of 33 positions shown; findings below may reference images not displayed]

FINDINGS: Pharynx and larynx: Oral cavity within normal limits without
discrete mass or collection. No acute abnormality about the
dentition. Palatine tonsils are mildly prominent and hypertrophied
bilaterally, suggesting possible acute tonsillitis given provided
history. No discrete tonsillar or peritonsillar abscess.
Parapharyngeal fat maintained. Adenoidal soft tissues prominent as
well. Remainder of the oropharynx and nasopharynx otherwise within
normal limits. No retropharyngeal collection. Epiglottis within
normal limits. Vallecula partially effaced by the lingual tonsils on
the left. Remainder of the hypopharynx and supraglottic larynx
within normal limits. True cords symmetric and normal. Subglottic
airway clear.

Salivary glands: Salivary glands including the parotid and
submandibular glands are within normal limits.

Thyroid: Normal.

Lymph nodes: Mildly prominent left level II lymph nodes measure up
to 11 mm, likely reactive. No other enlarged or pathologic
adenopathy within the neck.

Vascular: Normal intravascular enhancement seen throughout the neck.

Limited intracranial: Unremarkable.

Visualized orbits: Globes and orbital soft tissues within normal
limits.

Mastoids and visualized paranasal sinuses: Small air-fluid level
noted within the right maxillary sinus, consistent with acute
sinusitis. Visualized paranasal sinuses are otherwise clear. Mastoid
air cells and middle ear cavities are well pneumatized and free of
fluid.

Skeleton: No acute osseous finding. No discrete or worrisome osseous
lesions.

Upper chest: Minimal hazy ground-glass opacity along the right major
fissure favored to reflect atelectatic changes. Partially visualized
lungs are otherwise clear. Visualized upper chest demonstrates no
other acute finding.

Other: None.
IMPRESSION: 1. Mild prominence of the palatine tonsils bilaterally, suggesting
possible acute tonsillitis given provided history. No discrete
tonsillar or peritonsillar abscess.
2. Mildly prominent left level II lymph nodes, likely reactive.
3. Small air-fluid level within the right maxillary sinus,
consistent with acute sinusitis.

## 2021-05-27 ENCOUNTER — Other Ambulatory Visit: Payer: Self-pay

## 2021-05-27 ENCOUNTER — Ambulatory Visit (HOSPITAL_COMMUNITY)
Admission: EM | Admit: 2021-05-27 | Discharge: 2021-05-27 | Disposition: A | Discharge status: Home / Self Care | Payer: No Typology Code available for payment source | Attending: Physician Assistant | Admitting: Physician Assistant

## 2021-05-27 ENCOUNTER — Encounter (HOSPITAL_COMMUNITY): Payer: Self-pay | Admitting: *Deleted

## 2021-05-27 ENCOUNTER — Ambulatory Visit (INDEPENDENT_AMBULATORY_CARE_PROVIDER_SITE_OTHER): Payer: No Typology Code available for payment source

## 2021-05-27 DIAGNOSIS — T148XXA Other injury of unspecified body region, initial encounter: Secondary | ICD-10-CM

## 2021-05-27 DIAGNOSIS — S29012A Strain of muscle and tendon of back wall of thorax, initial encounter: Secondary | ICD-10-CM

## 2021-05-27 DIAGNOSIS — R079 Chest pain, unspecified: Secondary | ICD-10-CM | POA: Diagnosis not present

## 2021-05-27 MED ORDER — NAPROXEN 500 MG PO TABS: 500.0000 mg | ORAL_TABLET | Freq: Two times a day (BID) | ORAL | 0 refills | Status: DC

## 2021-05-27 MED ORDER — TIZANIDINE HCL 4 MG PO CAPS
4.0000 mg | ORAL_CAPSULE | Freq: Three times a day (TID) | ORAL | 0 refills | Status: DC | PRN
Start: 2021-05-27 — End: 2021-09-15

## 2021-05-27 MED ORDER — HYDROCODONE-ACETAMINOPHEN 5-325 MG PO TABS
1.0000 | ORAL_TABLET | Freq: Once | ORAL | Status: AC
  Administered 2021-05-27: 1 via ORAL

## 2021-05-27 MED ORDER — HYDROCODONE-ACETAMINOPHEN 5-325 MG PO TABS: 1.0000 | ORAL_TABLET | Freq: Every evening | ORAL | 0 refills | Status: AC | PRN

## 2021-05-27 MED ORDER — HYDROCODONE-ACETAMINOPHEN 5-325 MG PO TABS
ORAL_TABLET | ORAL | Status: AC
  Filled 2021-05-27: qty 1

## 2021-05-27 NOTE — ED Provider Notes (Signed)
[facility]    CSN: [id number] Arrival date & time: 05/27/21  1658      History   Chief Complaint Chief Complaint  Patient presents with   side pain     HPI Veronica Griffin is a 30 y.o. female.   Patient presents today with a several hour history of left back pain.  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  Pain is rated 7 on a 0-10 pain scale, described as sharp, worse with deep breathing, no alleviating factors identified.  She has tried Tylenol and ibuprofen without improvement of symptoms.  She denies any true chest pain, nausea, vomiting, diaphoresis.  She does report some shortness of breath but attributes this to pain.  She denies any numbness, tingling, paresthesias.  She does report difficulty with range of motion of her left arm secondary to pain in her back.   Past Medical History:  Diagnosis Date   Hypertension    Taking Aldomet 250mg  TID   Palpitations     Patient Active Problem List   Diagnosis Date Noted   Vaginal delivery 03/12/2013   First-degree perineal laceration, with delivery 03/12/2013   Pre-eclampsia added to pre-existing hypertension 03/07/2013   Hgb E 03/07/2013   Chronic hypertension in pregnancy 10/10/2012    Past Surgical History:  Procedure Laterality Date   NO PAST SURGERIES      OB History     Gravida  2   Para  1   Term  1   Preterm  0   AB  1   Living  1      SAB      IAB      Ectopic      Multiple  1   Live Births  1            Home Medications    Prior to Admission medications   Medication Sig Start Date End Date Taking? Authorizing Provider  HYDROcodone-acetaminophen (NORCO/VICODIN) 5-325 MG tablet Take 1 tablet by mouth at bedtime as needed for up to 3 days. 05/27/21 05/30/21 Yes Cheyanna Strick, 06/01/21, PA-C  naproxen (NAPROSYN) 500 MG tablet Take 1 tablet (500 mg total) by mouth 2 (two) times daily with a  meal. 05/27/21  Yes Azarion Hove K, PA-C  tiZANidine (ZANAFLEX) 4 MG capsule Take 1 capsule (4 mg total) by mouth 3 (three) times daily as needed for muscle spasms. 05/27/21  Yes Emmajean Ratledge K, PA-C  amoxicillin-clavulanate (AUGMENTIN) 875-125 MG tablet Take 1 tablet by mouth every 12 (twelve) hours. 01/28/20   01/30/20, MD  atenolol (TENORMIN) 25 MG tablet Take 1 tablet (25 mg total) by mouth daily. 03/13/20   05/13/20, MD  gabapentin (NEURONTIN) 300 MG capsule Take 1 capsule (300 mg total) by mouth 3 (three) times daily. 03/13/20   05/13/20, MD  labetalol (NORMODYNE) 200 MG tablet Take 2 tablets (400 mg total) by mouth 3 (three) times daily. 03/14/13   03/16/13, MD  valACYclovir (VALTREX) 1000 MG tablet Take 2,000 mg by mouth 2 (two) times daily. 12/29/19   [provider]    Family History Family History  Problem Relation Age of Onset   Hypertension Mother    Hypertension Father    Hypertension Maternal Aunt     Social History Social History   Tobacco Use   Smoking status: Former    Types: Cigarettes  Quit date: 12/01/2011    Years since quitting: 9.4   Smokeless tobacco: Never  Substance Use Topics   Alcohol use: No    Comment: Occasionally before +UPT   Drug use: No     Allergies   Patient has no known allergies.   Review of Systems Review of Systems  Constitutional:  Positive for activity change. Negative for appetite change, fatigue and fever.  Respiratory:  Positive for shortness of breath. Negative for cough.   Cardiovascular:  Negative for chest pain.  Gastrointestinal:  Negative for abdominal pain, diarrhea, nausea and vomiting.  Musculoskeletal:  Positive for back pain. Negative for arthralgias and myalgias.  Neurological:  Negative for dizziness, light-headedness and headaches.    Physical Exam Triage Vital Signs ED Triage Vitals  Enc Vitals Group     BP 05/27/21 1701 (!) 161/124     Pulse Rate 05/27/21 1701 79     Resp  05/27/21 1701 (!) 24     Temp --      Temp Source 05/27/21 1701 Oral     SpO2 05/27/21 1701 100 %     Weight --      Height --      Head Circumference --      Peak Flow --      Pain Score 05/27/21 1703 7     Pain Loc --      Pain Edu? --      Excl. in GC? --    No data found.  Updated Vital Signs BP (!) 147/108 (BP Location: Left Arm)   Pulse 83   Resp (!) 24   LMP 04/26/2021 (Approximate)   SpO2 100%   Visual Acuity Right Eye Distance:   Left Eye Distance:   Bilateral Distance:    Right Eye Near:   Left Eye Near:    Bilateral Near:     Physical Exam Vitals reviewed.  Constitutional:      General: She is awake. She is not in acute distress.    Appearance: Normal appearance. She is normal weight. She is not ill-appearing.     Comments: Very pleasant female appears stated age no acute distress sitting comfortably in exam room  HENT:     Head: Normocephalic and atraumatic.  Cardiovascular:     Rate and Rhythm: Normal rate and regular rhythm.     Heart sounds: Normal heart sounds, S1 normal and S2 normal. No murmur heard. Pulmonary:     Effort: Pulmonary effort is normal.     Breath sounds: Normal breath sounds. No wheezing, rhonchi or rales.     Comments: Clear to auscultation bilaterally Abdominal:     Palpations: Abdomen is soft.     Tenderness: There is no abdominal tenderness.  Musculoskeletal:     Cervical back: No tenderness or bony tenderness.     Thoracic back: Tenderness present. No bony tenderness.       Back:     Comments: Significant pain and swelling noted left thoracic back.  No pain percussion of vertebrae.  Decreased range of motion at left shoulder as result of pain.  Pincer grip strength normal bilateral hands.  Psychiatric:        Behavior: Behavior is cooperative.     UC Treatments / Results  Labs (all labs ordered are listed, but only abnormal results are displayed) Labs Reviewed - No data to display  EKG   Radiology DG Chest 2  View  Result Date: 05/27/2021 CLINICAL DATA:  Chest pain. EXAM:  CHEST - 2 VIEW COMPARISON:  None. FINDINGS: The heart size and mediastinal contours are within normal limits. Both lungs are clear. A radiopaque nipple piercing is seen on the left. The visualized skeletal structures are unremarkable. IMPRESSION: No active cardiopulmonary disease. Electronically Signed   By: Aram Candela M.D.   On: 05/27/2021 17:43    Procedures Procedures (including critical care time)  Medications Ordered in UC Medications  HYDROcodone-acetaminophen (NORCO/VICODIN) 5-325 MG per tablet 1 tablet (1 tablet Oral Given 05/27/21 1758)    Initial Impression / Assessment and Plan / UC Course  I have reviewed the triage vital signs and the nursing notes.  Pertinent labs & imaging results that were available during my care of the patient were reviewed by me and considered in my medical decision making (see chart for details).      EKG obtained showed normal sinus rhythm without ischemic changes with no previous to compare.  Chest x-ray obtained showed no acute abnormalities.  Given significant tenderness palpation over musculature difficulty with arm movement concern for muscle strain versus rupture.  Discussed that we are unable to obtain more advanced imaging in urgent care.  She was given hydrocodone in clinic to help manage pain with provided some relief of symptoms.  Final Clinical Impressions(s) / UC Diagnoses   Final diagnoses:  Strain of thoracic back region  Muscle strain     Discharge Instructions      I am concerned that you strained or injured a muscle.  I have started you on an anti-inflammatory medication that you can take twice a day.  You should not take additional NSAIDs including aspirin, ibuprofen/Advil, naproxen/Aleve due to risk of GI bleeding.  I have also called in a muscle relaxer.  This make you sleepy do not drive or drink alcohol with taking it.  I have called you with 3 doses of  hydrocodone that you can take at night.  This make you very sleepy do not drive drink alcohol taking it.  We typically do not fill out work restrictions so I recommend that you follow-up with your companies occupational health clinic first thing Monday morning for further evaluation.  If anything worsens you need to go to the hospital as we discussed.     ED Prescriptions     Medication Sig Dispense Auth. Provider   naproxen (NAPROSYN) 500 MG tablet Take 1 tablet (500 mg total) by mouth 2 (two) times daily with a meal. 20 tablet Quisha Mabie K, PA-C   tiZANidine (ZANAFLEX) 4 MG capsule Take 1 capsule (4 mg total) by mouth 3 (three) times daily as needed for muscle spasms. 15 capsule Faustino Luecke K, PA-C   HYDROcodone-acetaminophen (NORCO/VICODIN) 5-325 MG tablet Take 1 tablet by mouth at bedtime as needed for up to 3 days. 3 tablet Lenis Nettleton K, PA-C      I have reviewed the PDMP during this encounter.   Jeani Hawking, PA-C 05/27/21 1904

## 2021-05-27 NOTE — ED Triage Notes (Addendum)
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 Pt holding Rt side on arrival to [facility].

## 2021-05-27 NOTE — Discharge Instructions (Addendum)
I am concerned that you strained or injured a muscle.  I have started you on an anti-inflammatory medication that you can take twice a day.  You should not take additional NSAIDs including aspirin, ibuprofen/Advil, naproxen/Aleve due to risk of GI bleeding.  I have also called in a muscle relaxer.  This make you sleepy do not drive or drink alcohol with taking it.  I have called you with 3 doses of hydrocodone that you can take at night.  This make you very sleepy do not drive drink alcohol taking it.  We typically do not fill out work restrictions so I recommend that you follow-up with your companies occupational health clinic first thing Monday morning for further evaluation.  If anything worsens you need to go to the hospital as we discussed.

## 2021-07-20 ENCOUNTER — Other Ambulatory Visit: Payer: Self-pay

## 2021-07-20 ENCOUNTER — Encounter (HOSPITAL_BASED_OUTPATIENT_CLINIC_OR_DEPARTMENT_OTHER): Payer: Self-pay

## 2021-07-20 ENCOUNTER — Emergency Department (HOSPITAL_BASED_OUTPATIENT_CLINIC_OR_DEPARTMENT_OTHER): Payer: No Typology Code available for payment source

## 2021-07-20 ENCOUNTER — Emergency Department (HOSPITAL_BASED_OUTPATIENT_CLINIC_OR_DEPARTMENT_OTHER)
Admission: EM | Admit: 2021-07-20 | Discharge: 2021-07-20 | Disposition: A | Discharge status: Home / Self Care | Payer: No Typology Code available for payment source | Attending: Emergency Medicine | Admitting: Emergency Medicine

## 2021-07-20 DIAGNOSIS — M25561 Pain in right knee: Secondary | ICD-10-CM | POA: Insufficient documentation

## 2021-07-20 DIAGNOSIS — Y92410 Unspecified street and highway as the place of occurrence of the external cause: Secondary | ICD-10-CM | POA: Diagnosis not present

## 2021-07-20 DIAGNOSIS — Z79899 Other long term (current) drug therapy: Secondary | ICD-10-CM | POA: Insufficient documentation

## 2021-07-20 DIAGNOSIS — M62838 Other muscle spasm: Secondary | ICD-10-CM | POA: Diagnosis not present

## 2021-07-20 DIAGNOSIS — I10 Essential (primary) hypertension: Secondary | ICD-10-CM | POA: Insufficient documentation

## 2021-07-20 DIAGNOSIS — M545 Low back pain, unspecified: Secondary | ICD-10-CM | POA: Insufficient documentation

## 2021-07-20 DIAGNOSIS — M542 Cervicalgia: Secondary | ICD-10-CM | POA: Diagnosis not present

## 2021-07-20 DIAGNOSIS — F17210 Nicotine dependence, cigarettes, uncomplicated: Secondary | ICD-10-CM | POA: Insufficient documentation

## 2021-07-20 MED ORDER — LIDOCAINE 5 % EX PTCH: 1.0000 | MEDICATED_PATCH | CUTANEOUS | 0 refills | Status: DC

## 2021-07-20 MED ORDER — METHOCARBAMOL 500 MG PO TABS: 500.0000 mg | ORAL_TABLET | Freq: Two times a day (BID) | ORAL | 0 refills | Status: DC

## 2021-07-20 MED ORDER — AMLODIPINE BESYLATE 2.5 MG PO TABS: 2.5000 mg | ORAL_TABLET | Freq: Every day | ORAL | 0 refills | Status: DC

## 2021-07-20 MED ORDER — LIDOCAINE 5 % EX PTCH
2.0000 | MEDICATED_PATCH | CUTANEOUS | Status: DC
  Administered 2021-07-20: 2 via TRANSDERMAL
  Filled 2021-07-20: qty 2
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
## 2021-08-10 ENCOUNTER — Encounter (HOSPITAL_COMMUNITY): Payer: Self-pay

## 2021-08-10 ENCOUNTER — Telehealth: Payer: No Typology Code available for payment source | Admitting: Physician Assistant

## 2021-08-10 ENCOUNTER — Other Ambulatory Visit: Payer: Self-pay

## 2021-08-10 ENCOUNTER — Ambulatory Visit (HOSPITAL_COMMUNITY)
Admission: EM | Admit: 2021-08-10 | Discharge: 2021-08-10 | Disposition: A | Discharge status: Home / Self Care | Payer: No Typology Code available for payment source | Attending: Medical Oncology | Admitting: Medical Oncology

## 2021-08-10 DIAGNOSIS — I10 Essential (primary) hypertension: Secondary | ICD-10-CM | POA: Diagnosis not present

## 2021-08-10 DIAGNOSIS — R030 Elevated blood-pressure reading, without diagnosis of hypertension: Secondary | ICD-10-CM | POA: Insufficient documentation

## 2021-08-10 DIAGNOSIS — R42 Dizziness and giddiness: Secondary | ICD-10-CM | POA: Insufficient documentation

## 2021-08-10 DIAGNOSIS — R251 Tremor, unspecified: Secondary | ICD-10-CM | POA: Diagnosis not present

## 2021-08-10 LAB — CBC
HCT: 40.9 % (ref 36.0–46.0)
Hemoglobin: 13.3 g/dL (ref 12.0–15.0)
MCH: 24.9 pg — ABNORMAL LOW (ref 26.0–34.0)
MCHC: 32.5 g/dL (ref 30.0–36.0)
MCV: 76.6 fL — ABNORMAL LOW (ref 80.0–100.0)
Platelets: 357 10*3/uL (ref 150–400)
RBC: 5.34 MIL/uL — ABNORMAL HIGH (ref 3.87–5.11)
RDW: 14.3 % (ref 11.5–15.5)
WBC: 6.6 10*3/uL (ref 4.0–10.5)
nRBC: 0 % (ref 0.0–0.2)

## 2021-08-10 LAB — BASIC METABOLIC PANEL
Anion gap: 9 (ref 5–15)
BUN: 13 mg/dL (ref 6–20)
CO2: 24 mmol/L (ref 22–32)
Calcium: 8.9 mg/dL (ref 8.9–10.3)
Chloride: 102 mmol/L (ref 98–111)
Creatinine, Ser: 0.74 mg/dL (ref 0.44–1.00)
GFR, Estimated: >60 mL/min (ref >60)
Glucose, Bld: 99 mg/dL (ref 70–99)
Potassium: 3.8 mmol/L (ref 3.5–5.1)
Sodium: 135 mmol/L (ref 135–145)

## 2021-08-10 LAB — POC URINE PREG, ED: Preg Test, Ur: NEGATIVE

## 2021-08-10 NOTE — Progress Notes (Signed)
Virtual Visit Consent   [place], you are scheduled for a virtual visit with a [facility] provider today.     Just as with appointments in the office, your consent must be obtained to participate.  Your consent will be active for this visit and any virtual visit you may have with one of our providers in the next 365 days.     If you have a MyChart account, a copy of this consent can be sent to you electronically.  All virtual visits are billed to your insurance company just like a traditional visit in the office.    As this is a virtual visit, video technology does not allow for your provider to perform a traditional examination.  This may limit your provider's ability to fully assess your condition.  If your provider identifies any concerns that need to be evaluated in person or the need to arrange testing (such as labs, EKG, etc.), we will make arrangements to do so.     Although advances in technology are sophisticated, we cannot ensure that it will always work on either your end or our end.  If the connection with a video visit is poor, the visit may have to be switched to a telephone visit.  With either a video or telephone visit, we are not always able to ensure that we have a secure connection.     I need to obtain your verbal consent now.   Are you willing to proceed with your visit today?    Veronica Griffin has provided verbal consent on 08/10/2021 for a virtual visit (video or telephone).   Piedad Climes, [place]   Date: 08/10/2021 12:38 PM   Virtual Visit via Video Note   I, Piedad Climes, PA-C, attempted to connect with Veronica Griffin; MRN [id number] on 08/10/21 via [employer] to complete a video urgent care visit. The patient was unable to successfully connect to the video platform. As such, the patient was contacted by this provider via phone to complete the encounter.   Location: Patient: Virtual Visit Location Patient: Home Provider: Virtual Visit  Location Provider: Home Office   I discussed the limitations of evaluation and management by telemedicine and the availability of in person appointments. The patient expressed understanding and agreed to proceed.    History of Present Illness: Veronica Griffin is a 30 y.o. who identifies as a female who was assigned female at birth, and is being seen today for episode of nausea, shaking, chills and sweats occurring at work just a few moments ago. Notes she had just got to work when symptoms began. 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 Rechecked a few minutes later and down to 154/110. Has longstanding history of hypertension for which she taken multiple medications. Is currently without a primary care provider so has not had someone to follow-up with regarding BP. Denies chest pain, SOB, racing heart. Is wanting to know what she should do.   HPI: HPI  Problems:  Patient Active Problem List   Diagnosis Date Noted   Vaginal delivery 03/12/2013   First-degree perineal laceration, with delivery 03/12/2013   Pre-eclampsia added to pre-existing hypertension 03/07/2013   Hgb E 03/07/2013   Chronic hypertension in pregnancy 10/10/2012    Allergies: No Known Allergies Medications:  Current Outpatient Medications:    amLODipine (NORVASC) 2.5 MG tablet, Take 1 tablet (2.5 mg total) by mouth daily., Disp:  30 tablet, Rfl: 0   amoxicillin-clavulanate (AUGMENTIN) 875-125 MG tablet, Take 1 tablet by mouth every 12 (twelve) hours., Disp: 20 tablet, Rfl: 0   atenolol (TENORMIN) 25 MG tablet, Take 1 tablet (25 mg total) by mouth daily., Disp: 30 tablet, Rfl: 1   gabapentin (NEURONTIN) 300 MG capsule, Take 1 capsule (300 mg total) by mouth 3 (three) times daily., Disp: 90 capsule, Rfl: 0   labetalol (NORMODYNE) 200 MG tablet, Take 2 tablets (400 mg total) by mouth 3 (three) times daily., Disp: 90 tablet, Rfl: 3   lidocaine (LIDODERM) 5  %, Place 1 patch onto the skin daily. Remove & Discard patch within 12 hours or as directed by MD, Disp: 30 patch, Rfl: 0   methocarbamol (ROBAXIN) 500 MG tablet, Take 1 tablet (500 mg total) by mouth 2 (two) times daily., Disp: 20 tablet, Rfl: 0   naproxen (NAPROSYN) 500 MG tablet, Take 1 tablet (500 mg total) by mouth 2 (two) times daily with a meal., Disp: 20 tablet, Rfl: 0   tiZANidine (ZANAFLEX) 4 MG capsule, Take 1 capsule (4 mg total) by mouth 3 (three) times daily as needed for muscle spasms., Disp: 15 capsule, Rfl: 0   valACYclovir (VALTREX) 1000 MG tablet, Take 2,000 mg by mouth 2 (two) times daily., Disp: , Rfl:   Observations/Objective: Speech is clear and coherent with logical content.  Patient is alert and oriented at baseline.   Assessment and Plan: 1. Hypertension, unspecified type  2. Shakiness UC disposition given since she is right next to [facility] and wanting to avoid ER if possible. She is stable on the phone and feeling a bit better with BP trending down but still elevated. She needs a further assessment than what can be safely given via video. She agrees to proceed directly to the UC there (walking distance) with her co-worker.  Follow Up Instructions: I discussed the assessment and treatment plan with the patient. The patient was provided an opportunity to ask questions and all were answered. The patient agreed with the plan and demonstrated an understanding of the instructions.  A copy of instructions were sent to the patient via MyChart unless otherwise noted below.   The patient was advised to call back or seek an in-person evaluation if the symptoms worsen or if the condition fails to improve as anticipated.  Time:  I spent 10 minutes with the patient via telehealth technology discussing the above problems/concerns.    Piedad Climes, PA-C

## 2021-08-10 NOTE — Discharge Instructions (Addendum)
Please keep a blood pressure log for the next few days and schedule a follow up with your prescribing provider to see if they want to change your blood pressure medication. Please try to follow a DASH diet for the next few days as well.
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
## 2021-08-10 NOTE — ED Triage Notes (Signed)
Pt reports blood pressure 178/126 155/111 was 1 hr ago, nausea, chills.

## 2021-08-10 NOTE — Patient Instructions (Signed)
  Veronica Griffin, thank you for joining Piedad Climes, PA-C for today's virtual visit.  While this provider is not your primary care provider (PCP), if your PCP is located in our provider database this encounter information will be shared with them immediately following your visit.  Consent: (Patient) Veronica Griffin provided verbal consent for this virtual visit at the beginning of the encounter.  Current Medications:  Current Outpatient Medications:    amLODipine (NORVASC) 2.5 MG tablet, Take 1 tablet (2.5 mg total) by mouth daily., Disp: 30 tablet, Rfl: 0   amoxicillin-clavulanate (AUGMENTIN) 875-125 MG tablet, Take 1 tablet by mouth every 12 (twelve) hours., Disp: 20 tablet, Rfl: 0   atenolol (TENORMIN) 25 MG tablet, Take 1 tablet (25 mg total) by mouth daily., Disp: 30 tablet, Rfl: 1   gabapentin (NEURONTIN) 300 MG capsule, Take 1 capsule (300 mg total) by mouth 3 (three) times daily., Disp: 90 capsule, Rfl: 0   labetalol (NORMODYNE) 200 MG tablet, Take 2 tablets (400 mg total) by mouth 3 (three) times daily., Disp: 90 tablet, Rfl: 3   lidocaine (LIDODERM) 5 %, Place 1 patch onto the skin daily. Remove & Discard patch within 12 hours or as directed by MD, Disp: 30 patch, Rfl: 0   methocarbamol (ROBAXIN) 500 MG tablet, Take 1 tablet (500 mg total) by mouth 2 (two) times daily., Disp: 20 tablet, Rfl: 0   naproxen (NAPROSYN) 500 MG tablet, Take 1 tablet (500 mg total) by mouth 2 (two) times daily with a meal., Disp: 20 tablet, Rfl: 0   tiZANidine (ZANAFLEX) 4 MG capsule, Take 1 capsule (4 mg total) by mouth 3 (three) times daily as needed for muscle spasms., Disp: 15 capsule, Rfl: 0   valACYclovir (VALTREX) 1000 MG tablet, Take 2,000 mg by mouth 2 (two) times daily., Disp: , Rfl:    Medications ordered in this encounter:  No orders of the defined types were placed in this encounter.    *If you need refills on other medications prior to your next appointment, please contact your  pharmacy*  Follow-Up: Call back or seek an in-person evaluation if the symptoms worsen or if the condition fails to improve as anticipated.  If you have been instructed to have an in-person evaluation today at a local Urgent Care facility, please use the link below. It will take you to a list of all of our available [facility], including address, phone number and hours of operation. Please do not delay care.  [facility]  If you or a family member do not have a primary care provider, use the link below to schedule a visit and establish care. When you choose a [facility] primary care physician or advanced practice provider, you gain a long-term partner in health. Find a Primary Care Provider  Learn more about [facility]'s in-office and virtual care options: [facility] - Get Care Now

## 2021-09-01 ENCOUNTER — Telehealth: Payer: No Typology Code available for payment source | Admitting: Emergency Medicine

## 2021-09-01 ENCOUNTER — Encounter: Payer: Self-pay | Admitting: Emergency Medicine

## 2021-09-01 DIAGNOSIS — B029 Zoster without complications: Secondary | ICD-10-CM

## 2021-09-01 MED ORDER — VALACYCLOVIR HCL 1 G PO TABS: 1000.0000 mg | ORAL_TABLET | Freq: Three times a day (TID) | ORAL | 0 refills | Status: AC

## 2021-09-01 MED ORDER — GABAPENTIN 100 MG PO CAPS: 100.0000 mg | ORAL_CAPSULE | Freq: Two times a day (BID) | ORAL | 0 refills | Status: DC | PRN

## 2021-09-01 NOTE — Progress Notes (Signed)
E-visit for Shingles   We are sorry that you are not feeling well. Here is how we plan to help!  Based on what you shared with me it looks like you have shingles.  Shingles or herpes zoster, is a common infection of the nerves.  It is a painful rash caused by the herpes zoster virus.  This is the same virus that causes chickenpox.  After a person has chickenpox, the virus remains inactive in the nerve cells.  Years later, the virus can become active again and travel to the skin.  It typically will appear on one side of the face or body.  Burning or shooting pain, tingling, or itching are early signs of the infection.  Blisters typically scab over in 7 to 10 days and clear up within 2-4 weeks. Shingles is only contagious to people that have never had the chickenpox, the chickenpox vaccine, or anyone who has a compromised immune system.  You should avoid contact with these type of people until your blisters scab over.  I have prescribed Valacyclovir 1g three times daily for 7 days and also Gabapentin 300mg twice daily as needed for pain   HOME CARE: . Apply ice packs (wrapped in a thin towel), cool compresses, or soak in cool bath to help reduce pain. . Use calamine lotion to calm itchy skin. . Avoid scratching the rash. . Avoid direct sunlight.  GET HELP RIGHT AWAY IF: . Symptoms that don't away after treatment. . A rash or blisters near your eye. . Increased drainage, fever, or rash after treatment. . Severe pain that doesn't go away.   MAKE SURE YOU    Understand these instructions.  Will watch your condition.  Will get help right away if you are not doing well or get worse.  Thank you for choosing an e-visit. Your e-visit answers were reviewed by a board certified advanced clinical practitioner to complete your personal care plan. Depending upon the condition, your plan could have included both over the counter or prescription medications.  Please review your pharmacy choice.  Make sure the pharmacy is open so you can pick up prescription now. If there is a problem, you may contact your provider through MyChart messaging and have the prescription routed to another pharmacy.  Your safety is important to us. If you have drug allergies check your prescription carefully.   For the next 24 hours you can use MyChart to ask questions about today's visit, request a non-urgent call back, or ask for a work or school excuse.  You will get an email in the next two days asking about your experience. I hope that your e-visit has been valuable and will speed your recovery  

## 2021-09-01 NOTE — Progress Notes (Signed)
I have spent 5 minutes in review of e-visit questionnaire, review and updating patient chart, medical decision making and response to patient.   Mychele Seyller, PA-C    

## 2021-09-15 ENCOUNTER — Ambulatory Visit (INDEPENDENT_AMBULATORY_CARE_PROVIDER_SITE_OTHER): Payer: No Typology Code available for payment source | Admitting: Nurse Practitioner

## 2021-09-15 ENCOUNTER — Encounter (HOSPITAL_BASED_OUTPATIENT_CLINIC_OR_DEPARTMENT_OTHER): Payer: Self-pay | Admitting: Nurse Practitioner

## 2021-09-15 ENCOUNTER — Other Ambulatory Visit: Payer: Self-pay

## 2021-09-15 VITALS — BP 160/100 | HR 81 | Ht 67.0 in | Wt 173.0 lb

## 2021-09-15 DIAGNOSIS — Z13220 Encounter for screening for lipoid disorders: Secondary | ICD-10-CM | POA: Diagnosis not present

## 2021-09-15 DIAGNOSIS — D582 Other hemoglobinopathies: Secondary | ICD-10-CM

## 2021-09-15 DIAGNOSIS — Z13228 Encounter for screening for other metabolic disorders: Secondary | ICD-10-CM

## 2021-09-15 DIAGNOSIS — I10 Essential (primary) hypertension: Secondary | ICD-10-CM | POA: Diagnosis not present

## 2021-09-15 DIAGNOSIS — Z130 Encounter for screening for diseases of the blood and blood-forming organs and certain disorders involving the immune mechanism: Secondary | ICD-10-CM | POA: Diagnosis not present

## 2021-09-15 DIAGNOSIS — Z1329 Encounter for screening for other suspected endocrine disorder: Secondary | ICD-10-CM

## 2021-09-15 DIAGNOSIS — Z1321 Encounter for screening for nutritional disorder: Secondary | ICD-10-CM

## 2021-09-15 DIAGNOSIS — N871 Moderate cervical dysplasia: Secondary | ICD-10-CM | POA: Insufficient documentation

## 2021-09-15 DIAGNOSIS — N870 Mild cervical dysplasia: Secondary | ICD-10-CM

## 2021-09-15 DIAGNOSIS — F902 Attention-deficit hyperactivity disorder, combined type: Secondary | ICD-10-CM

## 2021-09-15 HISTORY — DX: Mild cervical dysplasia: N87.0

## 2021-09-15 HISTORY — DX: Other hemoglobinopathies: D58.2

## 2021-09-15 MED ORDER — AMPHETAMINE-DEXTROAMPHETAMINE 10 MG PO TABS: 10.0000 mg | ORAL_TABLET | Freq: Two times a day (BID) | ORAL | 0 refills | Status: DC

## 2021-09-15 MED ORDER — AMLODIPINE BESY-BENAZEPRIL HCL 5-10 MG PO CAPS: 1.0000 | ORAL_CAPSULE | Freq: Every day | ORAL | 2 refills | Status: DC

## 2021-09-15 NOTE — Assessment & Plan Note (Signed)
Significantly elevated BP in office today, although she is out of medication.  Home readings do not show good control on low dose amlodipine. Will plan to increase dose and add benazepril to combination to see if we can get improved control.  No symptoms present today. Will also monitor labs today for further evaluation.  Recommend daily home monitoring and reports BP > 150/90 to the office for recommendations.  Will consider increasing dose (double) if BP remains uncontrolled.

## 2021-09-15 NOTE — Assessment & Plan Note (Signed)
Positive ADHD self report checklist today as well as indications of exacerbation of anxiety symptoms and some depressive symptoms that correlate with ADHD.  At this time, we will see if we can get improved control of ADHD with daily 10mg  adderall BID.  Discussed expected relief with medication as well as warning signs that would warrant further evaluation.  We will plan to f/u in 4 weeks to monitor BP and ADHD symptom control with medication.

## 2021-09-15 NOTE — Patient Instructions (Addendum)
Thank you for choosing [facility] for your Primary Care needs. I am excited for the opportunity to partner with you to meet your health care goals. It was a pleasure meeting you today!  Recommendations from today's visit: I am going to increase the amlodipine and add another medication to it to see if we can get better control of your blood pressures. You can take this at bedtime.  I would like you to check your blood pressure once a day and monitor. If it is staying higher than 140/85 for more than 3 days (after being on the medication for a week), please let me know.  We will get labs today for evaluation   Information on diet, exercise, and health maintenance recommendations are listed below. This is information to help you be sure you are on track for optimal health and monitoring.   Please look over this and let us know if you have any questions or if you have completed any of the health maintenance outside of [place] so that we can be sure your records are up to date.  ___________________________________________________________ About Me: I am an Adult-Geriatric Nurse Practitioner with a background in caring for patients for more than 20 years with a strong intensive care background. I provide primary care and sports medicine services to patients age 25 and older within this office. My education had a strong focus on caring for the older adult population, which I am passionate about. I am also the director of the APP Fellowship with [facility].   My desire is to provide you with the best service through preventive medicine and supportive care. I consider you a part of the medical team and value your input. I work diligently to ensure that you are heard and your needs are met in a safe and effective manner. I want you to feel comfortable with me as your provider and want you to know that your health concerns are important to me.  For your information, our  office hours are: Monday, Tuesday, and Thursday 8:00 AM - 5:00 PM Wednesday and Friday 8:00 AM - 12:00 PM.   In my time away from the office I am teaching new APP's within the system and am unavailable, but my partner, Dr. Burnard Bunting is in the office for emergent needs.   If you have questions or concerns, please call our office at [phone] or send [place] a MyChart message and we will respond as quickly as possible.  ____________________________________________________________ MyChart:  For all urgent or time sensitive needs we ask that you please call the office to avoid delays. Our number is [phone]. MyChart is not constantly monitored and due to the large volume of messages a day, replies may take up to 72 business hours.  MyChart Policy: MyChart allows for you to see your visit notes, after visit summary, provider recommendations, lab and tests results, make an appointment, request refills, and contact your provider or the office for non-urgent questions or concerns. Providers are seeing patients during normal business hours and do not have built in time to review MyChart messages.  We ask that you allow a minimum of 3 business days for responses to [employer]. For this reason, please do not send urgent requests through [place]. Please call the office at [phone]. New and ongoing conditions may require a visit. We have virtual and in person visit available for your convenience.  Complex MyChart concerns may require a visit. Your provider may request  you schedule a virtual or in person visit to ensure we are providing the best care possible. MyChart messages sent after 11:00 AM on Friday will not be received by the provider until Monday morning.    Lab and Test Results: You will receive your lab and test results on MyChart as soon as they are completed and results have been sent by the lab or testing facility. Due to this service, you will receive your results BEFORE your  provider.  I review lab and tests results each morning prior to seeing patients. Some results require collaboration with other providers to ensure you are receiving the most appropriate care. For this reason, we ask that you please allow a minimum of 3-5 business days from the time the ALL results have been received for your provider to receive and review lab and test results and contact you about these.  Most lab and test result comments from the provider will be sent through [place]. Your provider may recommend changes to the plan of care, follow-up visits, repeat testing, ask questions, or request an office visit to discuss these results. You may reply directly to this message or call the office at [phone] to provide information for the provider or set up an appointment. In some instances, you will be called with test results and recommendations. Please let us know if this is preferred and we will make note of this in your chart to provide this for you.    If you have not heard a response to your lab or test results in 5 business days from all results returning to [place], please call the office to let us know. We ask that you please avoid calling prior to this time unless there is an emergent concern. Due to high call volumes, this can delay the resulting process.  After Hours: For all non-emergency after hours needs, please call the office at [phone] and select the option to reach the on-call provider service. On-call services are shared between multiple [place] offices and therefore it will not be possible to speak directly with your provider. On-call providers may provide medical advice and recommendations, but are unable to provide refills for maintenance medications.  For all emergency or urgent medical needs after normal business hours, we recommend that you seek care at the closest Urgent Care or Emergency Department to ensure appropriate treatment in a timely manner.  [facility] [place] has a 24 hour emergency room located on the ground floor for your convenience.   Urgent Concerns During the Business Day Providers are seeing patients from 8AM to [place] with a busy schedule and are most often not able to respond to non-urgent calls until the end of the day or the next business day. If you should have URGENT concerns during the day, please call and speak to the nurse or schedule a same day appointment so that we can address your concern without delay.   Thank you, again, for choosing me as your health care partner. I appreciate your trust and look forward to learning more about you.   Worthy Keeler, DNP, AGNP-c ___________________________________________________________  Health Maintenance Recommendations Screening Testing Mammogram Every 1 -2 years based on history and risk factors Starting at age 74 Pap Smear Ages 21-39 every 3 years Ages 33-65 every 5 years with HPV testing More frequent testing may be required based on results and history Colon Cancer Screening Every 1-10 years based on test performed, risk factors, and history Starting at age 45  Bone Density Screening Every 2-10 years based on history Starting at age 29 for women Recommendations for men differ based on medication usage, history, and risk factors AAA Screening One time ultrasound Men 63-8 years old who have every smoked Lung Cancer Screening Low Dose Lung CT every 12 months Age 73-80 years with a 30 pack-year smoking history who still smoke or who have quit within the last 15 years  Screening Labs Routine  Labs: Complete Blood Count (CBC), Complete Metabolic Panel (CMP), Cholesterol (Lipid Panel) Every 6-12 months based on history and medications May be recommended more frequently based on current conditions or previous results Hemoglobin A1c Lab Every 3-12 months based on history and previous results Starting at age 36 or earlier with diagnosis of diabetes, high  cholesterol, BMI >26, and/or risk factors Frequent monitoring for patients with diabetes to ensure blood sugar control Thyroid Panel (TSH w/ T3 & T4) Every 6 months based on history, symptoms, and risk factors May be repeated more often if on medication HIV One time testing for all patients 9 and older May be repeated more frequently for patients with increased risk factors or exposure Hepatitis C One time testing for all patients 36 and older May be repeated more frequently for patients with increased risk factors or exposure Gonorrhea, Chlamydia Every 12 months for all sexually active persons 13-24 years Additional monitoring may be recommended for those who are considered high risk or who have symptoms PSA Men 18-24 years old with risk factors Additional screening may be recommended from age 11-69 based on risk factors, symptoms, and history  Vaccine Recommendations Tetanus Booster All adults every 10 years Flu Vaccine All patients 6 months and older every year COVID Vaccine All patients 12 years and older Initial dosing with booster May recommend additional booster based on age and health history HPV Vaccine 2 doses all patients age 66-26 Dosing may be considered for patients over 26 Shingles Vaccine (Shingrix) 2 doses all adults 67 years and older Pneumonia (Pneumovax 23) All adults 92 years and older May recommend earlier dosing based on health history Pneumonia (Prevnar 35) All adults 43 years and older Dosed 1 year after Pneumovax 23  Additional Screening, Testing, and Vaccinations may be recommended on an individualized basis based on family history, health history, risk factors, and/or exposure.  __________________________________________________________  Diet Recommendations for All Patients  I recommend that all patients maintain a diet low in saturated fats, carbohydrates, and cholesterol. While this can be challenging at first, it is not impossible and small  changes can make big differences.  Things to try: Decreasing the amount of soda, sweet tea, and/or juice to one or less per day and replace with water While water is always the first choice, if you do not like water you may consider adding a water additive without sugar to improve the taste other sugar free drinks Replace potatoes with a brightly colored vegetable at dinner Use healthy oils, such as canola oil or olive oil, instead of butter or hard margarine Limit your bread intake to two pieces or less a day Replace regular pasta with low carb pasta options Bake, broil, or grill foods instead of frying Monitor portion sizes  Eat smaller, more frequent meals throughout the day instead of large meals  An important thing to remember is, if you love foods that are not great for your health, you don't have to give them up completely. Instead, allow these foods to be a reward when you have done well. Allowing  yourself to still have special treats every once in a while is a nice way to tell yourself thank you for working hard to keep yourself healthy.   Also remember that every day is a new day. If you have a bad day and "fall off the wagon", you can still climb right back up and keep moving along on your journey!  We have resources available to help you!  Some websites that may be helpful include: www.http://carter.biz/  Www.VeryWellFit.com _____________________________________________________________  Activity Recommendations for All Patients  I recommend that all adults get at least 20 minutes of moderate physical activity that elevates your heart rate at least 5 days out of the week.  Some examples include: Walking or jogging at a pace that allows you to carry on a conversation Cycling (stationary bike or outdoors) Water aerobics Yoga Weight lifting Dancing If physical limitations prevent you from putting stress on your joints, exercise in a pool or seated in a chair are excellent  options.  Do determine your MAXIMUM heart rate for activity: YOUR AGE - 220 = MAX HeartRate   Remember! Do not push yourself too hard.  Start slowly and build up your pace, speed, weight, time in exercise, etc.  Allow your body to rest between exercise and get good sleep. You will need more water than normal when you are exerting yourself. Do not wait until you are thirsty to drink. Drink with a purpose of getting in at least 8, 8 ounce glasses of water a day plus more depending on how much you exercise and sweat.    If you begin to develop dizziness, chest pain, abdominal pain, jaw pain, shortness of breath, headache, vision changes, lightheadedness, or other concerning symptoms, stop the activity and allow your body to rest. If your symptoms are severe, seek emergency evaluation immediately. If your symptoms are concerning, but not severe, please let us know so that we can recommend further evaluation.
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
## 2021-09-16 LAB — CBC WITH DIFFERENTIAL/PLATELET
Basophils Absolute: 0 10*3/uL (ref 0.0–0.2)
Basos: 1 %
EOS (ABSOLUTE): 0.1 10*3/uL (ref 0.0–0.4)
Eos: 2 %
Hematocrit: 37.6 % (ref 34.0–46.6)
Hemoglobin: 12.8 g/dL (ref 11.1–15.9)
Immature Grans (Abs): 0 10*3/uL (ref 0.0–0.1)
Immature Granulocytes: 0 %
Lymphocytes Absolute: 1.8 10*3/uL (ref 0.7–3.1)
Lymphs: 32 %
MCH: 25.3 pg — ABNORMAL LOW (ref 26.6–33.0)
MCHC: 34 g/dL (ref 31.5–35.7)
MCV: 75 fL — ABNORMAL LOW (ref 79–97)
Monocytes Absolute: 0.5 10*3/uL (ref 0.1–0.9)
Monocytes: 8 %
Neutrophils Absolute: 3.3 10*3/uL (ref 1.4–7.0)
Neutrophils: 57 %
Platelets: 328 10*3/uL (ref 150–450)
RBC: 5.05 x10E6/uL (ref 3.77–5.28)
RDW: 13.9 % (ref 11.7–15.4)
WBC: 5.8 10*3/uL (ref 3.4–10.8)

## 2021-09-16 LAB — COMPREHENSIVE METABOLIC PANEL
ALT: 13 IU/L (ref 0–32)
AST: 14 IU/L (ref 0–40)
Albumin/Globulin Ratio: 2.2 (ref 1.2–2.2)
Albumin: 5 g/dL (ref 3.9–5.0)
Alkaline Phosphatase: 40 IU/L — ABNORMAL LOW (ref 44–121)
BUN/Creatinine Ratio: 17 (ref 9–23)
BUN: 15 mg/dL (ref 6–20)
Bilirubin Total: 0.5 mg/dL (ref 0.0–1.2)
CO2: 20 mmol/L (ref 20–29)
Calcium: 9.2 mg/dL (ref 8.7–10.2)
Chloride: 102 mmol/L (ref 96–106)
Creatinine, Ser: 0.86 mg/dL (ref 0.57–1.00)
Globulin, Total: 2.3 g/dL (ref 1.5–4.5)
Glucose: 91 mg/dL (ref 70–99)
Potassium: 4.5 mmol/L (ref 3.5–5.2)
Sodium: 138 mmol/L (ref 134–144)
Total Protein: 7.3 g/dL (ref 6.0–8.5)
eGFR: 93 mL/min/{1.73_m2} (ref >59)

## 2021-09-16 LAB — HEMOGLOBIN A1C
Est. average glucose Bld gHb Est-mCnc: 108 mg/dL
Hgb A1c MFr Bld: 5.4 % (ref 4.8–5.6)

## 2021-09-16 LAB — VITAMIN D 25 HYDROXY (VIT D DEFICIENCY, FRACTURES): Vit D, 25-Hydroxy: 24.7 ng/mL — ABNORMAL LOW (ref 30.0–100.0)

## 2021-09-16 LAB — T3: T3, Total: 130 ng/dL (ref 71–180)

## 2021-09-16 LAB — LIPID PANEL
Chol/HDL Ratio: 2.7 ratio (ref 0.0–4.4)
Cholesterol, Total: 146 mg/dL (ref 100–199)
HDL: 55 mg/dL (ref >39)
LDL Chol Calc (NIH): 76 mg/dL (ref 0–99)
Triglycerides: 76 mg/dL (ref 0–149)
VLDL Cholesterol Cal: 15 mg/dL (ref 5–40)

## 2021-09-16 LAB — TSH: TSH: 3.18 u[IU]/mL (ref 0.450–4.500)

## 2021-09-16 LAB — T4: T4, Total: 8 ug/dL (ref 4.5–12.0)

## 2021-09-21 ENCOUNTER — Other Ambulatory Visit (HOSPITAL_BASED_OUTPATIENT_CLINIC_OR_DEPARTMENT_OTHER): Payer: Self-pay | Admitting: Nurse Practitioner

## 2021-09-21 DIAGNOSIS — E611 Iron deficiency: Secondary | ICD-10-CM

## 2021-09-21 DIAGNOSIS — E559 Vitamin D deficiency, unspecified: Secondary | ICD-10-CM

## 2021-09-21 MED ORDER — VITAMIN D (ERGOCALCIFEROL) 1.25 MG (50000 UNIT) PO CAPS: 50000.0000 [IU] | ORAL_CAPSULE | ORAL | 0 refills | Status: DC

## 2021-10-13 ENCOUNTER — Encounter (HOSPITAL_BASED_OUTPATIENT_CLINIC_OR_DEPARTMENT_OTHER): Payer: Self-pay | Admitting: Nurse Practitioner

## 2021-10-13 ENCOUNTER — Telehealth (INDEPENDENT_AMBULATORY_CARE_PROVIDER_SITE_OTHER): Payer: No Typology Code available for payment source | Admitting: Nurse Practitioner

## 2021-10-13 ENCOUNTER — Other Ambulatory Visit: Payer: Self-pay

## 2021-10-13 VITALS — Ht 67.0 in | Wt 170.0 lb

## 2021-10-13 DIAGNOSIS — R0683 Snoring: Secondary | ICD-10-CM | POA: Diagnosis not present

## 2021-10-13 DIAGNOSIS — I10 Essential (primary) hypertension: Secondary | ICD-10-CM

## 2021-10-13 HISTORY — DX: Snoring: R06.83

## 2021-10-13 MED ORDER — AMLODIPINE BESY-BENAZEPRIL HCL 10-20 MG PO CAPS: 1.0000 | ORAL_CAPSULE | Freq: Every day | ORAL | 3 refills | Status: DC

## 2021-10-13 NOTE — Progress Notes (Signed)
Virtual Visit Encounter telephone visit.   I connected with  [name] on 10/13/21 at  9:10 AM EST by secure audio and/or video enabled telemedicine application. I verified that I am speaking with the correct person using two identifiers.   I introduced myself as a Designer, jewellery with the practice. The limitations of evaluation and management by telemedicine discussed with the patient and the availability of in person appointments. The patient expressed verbal understanding and consent to proceed.  Participating parties in this visit include: Myself and patient  The patient is: Patient Location: Home I am: Provider Location: Office/Clinic Subjective:    CC and HPI: Veronica Griffin is a 31 y.o. year old female presenting for follow up of HTN. She tells me she has been taking her medication on a routine basis. She has not been checking her BP daily, but when she has checked it is running in the 130's - 140's/ upper 90's.  This morning her BP is 130/98. She also endorses concerns with snoring, daytime fatigue, decreased concentration, and she reports her partner has told her she gasps for air in the night when snoring.  She is concerned about possible OSA.   Past medical history, Surgical history, Family history not pertinant except as noted below, Social history, Allergies, and medications have been entered into the medical record, reviewed, and corrections made.   Review of Systems:  All review of systems negative except what is listed in the HPI  Objective:    Alert and oriented x 4 Speaking in clear sentences with no shortness of breath. No distress.  Impression and Recommendations:    Problem List Items Addressed This Visit     Primary hypertension - Primary    Blood pressure is somewhat improved however still remains elevated more than we would like to see. Recommend increase in amlodipine-benazepril to 10-20 mg daily. We will also send for evaluation with  neurology for suspected sleep apnea due to snoring and daytime fatigue in the setting of hypertension. We will plan to follow-up with patient in approximately 6 weeks to ensure that blood pressures are better managed on new dose of medication. Discussed with patient that we may need to titrate medication back down if sleep apnea is detected and this is treated as this could resolve some of the issues.      Relevant Medications   amLODipine-benazepril (LOTREL) 10-20 MG capsule   Other Relevant Orders   Ambulatory referral to Neurology   Habitual snoring    Nightly snoring with partner reporting gasping snores. Also experiencing hypertension which is somewhat resistant to medication. Given patient's symptoms I feel evaluation by neurology for possible sleep apnea is advised. She is agreeable to this. We will plan to continue to follow-up on blood pressure monitor closely as we await testing results.      Relevant Orders   Ambulatory referral to Neurology    orders and follow up as documented in EMR I discussed the assessment and treatment plan with the patient. The patient was provided an opportunity to ask questions and all were answered. The patient agreed with the plan and demonstrated an understanding of the instructions.   The patient was advised to call back or seek an in-person evaluation if the symptoms worsen or if the condition fails to improve as anticipated.  Follow-Up: 6 weeks  I provided 20 minutes of non-face-to-face interaction with this non face-to-face encounter including intake, same-day documentation, and chart review.   Orma Render, NP , DNP,  AGNP-c [facility] [phone] (fax)

## 2021-10-13 NOTE — Assessment & Plan Note (Signed)
Nightly snoring with partner reporting gasping snores. Also experiencing hypertension which is somewhat resistant to medication. Given patient's symptoms I feel evaluation by neurology for possible sleep apnea is advised. She is agreeable to this. We will plan to continue to follow-up on blood pressure monitor closely as we await testing results.

## 2021-10-13 NOTE — Patient Instructions (Signed)

## 2021-10-13 NOTE — Assessment & Plan Note (Signed)
Blood pressure is somewhat improved however still remains elevated more than we would like to see. Recommend increase in amlodipine-benazepril to 10-20 mg daily. We will also send for evaluation with neurology for suspected sleep apnea due to snoring and daytime fatigue in the setting of hypertension. We will plan to follow-up with patient in approximately 6 weeks to ensure that blood pressures are better managed on new dose of medication. Discussed with patient that we may need to titrate medication back down if sleep apnea is detected and this is treated as this could resolve some of the issues.

## 2021-11-03 ENCOUNTER — Encounter (HOSPITAL_BASED_OUTPATIENT_CLINIC_OR_DEPARTMENT_OTHER): Payer: Self-pay | Admitting: Nurse Practitioner

## 2021-11-04 ENCOUNTER — Telehealth (HOSPITAL_BASED_OUTPATIENT_CLINIC_OR_DEPARTMENT_OTHER): Payer: Self-pay | Admitting: Nurse Practitioner

## 2021-11-04 NOTE — Telephone Encounter (Signed)
LVMTCB for pt to call back to sch an ov with provider for BP medication change per CMA Tay.

## 2021-11-04 NOTE — Telephone Encounter (Signed)
Pt called back and is scheduled for 11/10/21

## 2021-11-10 ENCOUNTER — Ambulatory Visit (HOSPITAL_BASED_OUTPATIENT_CLINIC_OR_DEPARTMENT_OTHER): Payer: No Typology Code available for payment source | Admitting: Nurse Practitioner

## 2021-11-18 ENCOUNTER — Ambulatory Visit (HOSPITAL_BASED_OUTPATIENT_CLINIC_OR_DEPARTMENT_OTHER): Payer: Self-pay | Admitting: Nurse Practitioner

## 2021-11-22 ENCOUNTER — Other Ambulatory Visit (HOSPITAL_BASED_OUTPATIENT_CLINIC_OR_DEPARTMENT_OTHER): Payer: Self-pay | Admitting: Nurse Practitioner

## 2021-11-22 DIAGNOSIS — E559 Vitamin D deficiency, unspecified: Secondary | ICD-10-CM

## 2021-11-24 ENCOUNTER — Other Ambulatory Visit: Payer: Self-pay | Admitting: Obstetrics and Gynecology

## 2021-12-09 ENCOUNTER — Encounter (HOSPITAL_BASED_OUTPATIENT_CLINIC_OR_DEPARTMENT_OTHER): Payer: Self-pay | Admitting: Nurse Practitioner

## 2022-01-02 ENCOUNTER — Institutional Professional Consult (permissible substitution): Payer: No Typology Code available for payment source | Admitting: Neurology

## 2022-01-31 ENCOUNTER — Encounter (HOSPITAL_BASED_OUTPATIENT_CLINIC_OR_DEPARTMENT_OTHER): Payer: Self-pay | Admitting: Nurse Practitioner

## 2022-02-15 ENCOUNTER — Encounter (HOSPITAL_BASED_OUTPATIENT_CLINIC_OR_DEPARTMENT_OTHER): Payer: Self-pay | Admitting: Nurse Practitioner

## 2022-02-15 ENCOUNTER — Ambulatory Visit: Payer: No Typology Code available for payment source | Admitting: Neurology

## 2022-02-15 ENCOUNTER — Other Ambulatory Visit (HOSPITAL_COMMUNITY): Payer: Self-pay

## 2022-02-15 ENCOUNTER — Encounter: Payer: Self-pay | Admitting: Neurology

## 2022-02-15 ENCOUNTER — Ambulatory Visit (INDEPENDENT_AMBULATORY_CARE_PROVIDER_SITE_OTHER): Payer: No Typology Code available for payment source | Admitting: Nurse Practitioner

## 2022-02-15 VITALS — BP 159/113 | HR 92 | Ht 67.0 in | Wt 178.0 lb

## 2022-02-15 VITALS — BP 130/100 | HR 94 | Ht 67.0 in | Wt 178.9 lb

## 2022-02-15 DIAGNOSIS — Z13228 Encounter for screening for other metabolic disorders: Secondary | ICD-10-CM

## 2022-02-15 DIAGNOSIS — E559 Vitamin D deficiency, unspecified: Secondary | ICD-10-CM

## 2022-02-15 DIAGNOSIS — I10 Essential (primary) hypertension: Secondary | ICD-10-CM

## 2022-02-15 DIAGNOSIS — R0683 Snoring: Secondary | ICD-10-CM | POA: Insufficient documentation

## 2022-02-15 DIAGNOSIS — D565 Hemoglobin E-beta thalassemia: Secondary | ICD-10-CM

## 2022-02-15 DIAGNOSIS — F902 Attention-deficit hyperactivity disorder, combined type: Secondary | ICD-10-CM

## 2022-02-15 DIAGNOSIS — F519 Sleep disorder not due to a substance or known physiological condition, unspecified: Secondary | ICD-10-CM

## 2022-02-15 DIAGNOSIS — R0689 Other abnormalities of breathing: Secondary | ICD-10-CM | POA: Insufficient documentation

## 2022-02-15 DIAGNOSIS — E611 Iron deficiency: Secondary | ICD-10-CM | POA: Diagnosis not present

## 2022-02-15 DIAGNOSIS — R030 Elevated blood-pressure reading, without diagnosis of hypertension: Secondary | ICD-10-CM

## 2022-02-15 DIAGNOSIS — J3089 Other allergic rhinitis: Secondary | ICD-10-CM

## 2022-02-15 DIAGNOSIS — J301 Allergic rhinitis due to pollen: Secondary | ICD-10-CM | POA: Insufficient documentation

## 2022-02-15 DIAGNOSIS — R569 Unspecified convulsions: Secondary | ICD-10-CM

## 2022-02-15 DIAGNOSIS — G4719 Other hypersomnia: Secondary | ICD-10-CM | POA: Insufficient documentation

## 2022-02-15 DIAGNOSIS — Z130 Encounter for screening for diseases of the blood and blood-forming organs and certain disorders involving the immune mechanism: Secondary | ICD-10-CM

## 2022-02-15 DIAGNOSIS — Z1329 Encounter for screening for other suspected endocrine disorder: Secondary | ICD-10-CM

## 2022-02-15 DIAGNOSIS — Z1321 Encounter for screening for nutritional disorder: Secondary | ICD-10-CM

## 2022-02-15 HISTORY — DX: Elevated blood-pressure reading, without diagnosis of hypertension: R03.0

## 2022-02-15 HISTORY — DX: Other abnormalities of breathing: R06.89

## 2022-02-15 MED ORDER — HYDROCORTISONE 1 % EX LOTN
1.0000 | TOPICAL_LOTION | Freq: Two times a day (BID) | CUTANEOUS | 2 refills | Status: DC
Start: 2022-02-15 — End: 2022-07-19
  Filled 2022-02-15: qty 114, 15d supply, fill #0

## 2022-02-15 MED ORDER — MOMETASONE FUROATE 50 MCG/ACT NA SUSP
NASAL | 6 refills | Status: DC
  Filled 2022-02-15: qty 17, 30d supply, fill #0

## 2022-02-15 MED ORDER — FLUTICASONE PROPIONATE 50 MCG/ACT NA SUSP
2.0000 | Freq: Every day | NASAL | 6 refills | Status: DC
  Filled 2022-02-15: qty 16, 30d supply, fill #0

## 2022-02-15 MED ORDER — HYDROCHLOROTHIAZIDE 12.5 MG PO CAPS
12.5000 mg | ORAL_CAPSULE | Freq: Every day | ORAL | 5 refills | Status: DC
  Filled 2022-02-16: qty 30, 30d supply, fill #0

## 2022-02-15 MED ORDER — LABETALOL HCL 100 MG PO TABS
ORAL_TABLET | ORAL | 1 refills | Status: DC
  Filled 2022-02-15: qty 120, 30d supply, fill #0
  Filled 2022-04-06: qty 120, 30d supply, fill #1

## 2022-02-15 MED ORDER — CROMOLYN SODIUM 4 % OP SOLN
1.0000 [drp] | Freq: Four times a day (QID) | OPHTHALMIC | 12 refills | Status: DC
  Filled 2022-02-15: qty 10, 13d supply, fill #0

## 2022-02-15 NOTE — Progress Notes (Signed)
? ? ?SLEEP MEDICINE CLINIC ?  ? ?Provider:  Larey Seat, MD  ?Primary Care Physician:  Orma Render, NP ?[address] ?[place] [address]  ? ?  ?Referring Provider: Orma Render, Np ?[address] ?[address] ?[place],  [place] [address]  ?  ?  ?    ?Chief Complaint according to patient   ?Patient presents with:  ?  ? New Patient (Initial Visit)  ?     ?  ?  ?HISTORY OF PRESENT ILLNESS:  ? ?02-15-2022 ?Veronica Griffin is a 31 y.o. year old  [place] female patient , she is seen on 02/15/2022 from NP [facility] for a Sleep Consult.  ?Chief concern according to patient :   Pt referred for snoring concerns, daytime fatigue, decreased concentration, and witnessed sleep apnea. No prior Sleep Studies. Boyfriend has OSA and uses CPAP. She falls asleep, easily and any time. Wakes choking, gasping.  ?  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 First-degree perineal laceration, with delivery (03/12/2013), Pre-eclampsia added to pre-existing hypertension (03/07/2013), and Vaginal delivery (03/12/2013). ?Frequent sinusitis, rhinitis in childhood/ allergies.  ?Snoring for years.  ? Sleep relevant medical history: none .  ?  ? Family medical /sleep history: Father with OSA. 2 older sisters are snoring. Hypersomnia.  ?  ?Social history:  Patient is working as Therapist, music. Now in training for [employer] and lives in a household with 3 persons. Family status is in a relation ship , with 1 child    ?The patient currently works early morning, some night shifts.  ?No Pets. ?Tobacco use/ vaping 2-4 times a day in the car. ?ETOH use rarely,  ?Caffeine intake in form of Coffee( /) Soda( 3-4 / week) Tea ( /) 2-3  energy drinks per week, Monster or Bang. ?Regular exercise in form of walking.  ? ?  ?Sleep habits are as follows: returns home at  4.30- The patient's dinner time is between 8-9 PM. The patient goes to bed at 9.30-10.30 PM and continues to sleep for 7-8 hours, rarely wakes for  bathroom breaks, mostly for choking or gasping.  ?The preferred sleep position is supine, with the support of 1 pillow.  ?Dreams are reportedly frequent/vivid.  ? 6.40 AM is the usual rise time. The patient wakes up at 6.40 with an alarm.  ? She reports not feeling refreshed or restored in AM, with symptoms such as dry mouth, morning headaches, and residual fatigue. Naps are taken infrequently.  They don't influence her sleep at night.  ? ?She feels she dreams all the time, and wakes up from her dreams. VIVID.  ?No sleep paralysis,  ?No cataplexy.  ?  ?Review of Systems: ?Out of a complete 14 system review, the patient complains of only the following symptoms, and all other reviewed systems are negative.:  ?Fatigue, sleepiness , snoring, hypersomnia, gasping.  ?Palpitations  ?No diaphoresis.  ?  ?How likely are you to doze in the following situations: ?0 = not likely, 1 = slight chance, 2 = moderate chance, 3 = high chance ?  ?Sitting and Reading? ?Watching Television? ?Sitting inactive in a public place (theater or meeting)? ?As a passenger in a car for an hour without a break? ?Lying down in the afternoon when circumstances permit? ?Sitting and talking to someone? ?Sitting quietly after lunch without alcohol? ?In a car, while stopped for  a few minutes in traffic? ?  ?Total = 16/ 24 points  ? FSS endorsed at 36/ 63 points.  ? ?Social History  ? ?Socioeconomic History  ? Marital status: Significant Other  ?  Spouse name: Not on file  ? Number of children: 1  ? Years of education: 13.5  ? Highest education level: Not on file  ?Occupational History  ? 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  Employer: [employer]  ?Tobacco Use  ? Smoking status: Former  ?  Types: E-cigarettes  ?  Start date: 10/02/2017  ?  Quit date: 07/2021  ?  Years since quitting: 0.6  ? Smokeless tobacco:  Never  ?Vaping Use  ? Vaping Use: Every day  ?Substance and Sexual Activity  ? Alcohol use: Yes  ?  Comment: occ  ? Drug use: No  ? Sexual activity: Not on file  ?Other Topics Concern  ? Not on file  ?Social History Narrative  ? Lives with boyfriend and daughter  ? R handed  ? Caffeine: 2-3 cans of energy drinks in a week.   ? ?Social Determinants of Health  ? ?Financial Resource Strain: Not on file  ?Food Insecurity: Not on file  ?Transportation Needs: Not on file  ?Physical Activity: Not on file  ?Stress: Not on file  ?Social Connections: Not on file  ? ? ?Family History  ?Problem Relation Age of Onset  ? Hypertension Mother   ? Hypertension Father   ? Hypertension Maternal Aunt   ? ? ?Past Medical History:  ?Diagnosis Date  ? Antepartum hemorrhage 10/08/2012  ? Had [place] in MAU at 8 weeks--large [facility]. Repeat US at 10 weeks at [facility]--viable IUP, [facility] noted.  ? 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  ? 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 On Aldomet 250 mg po TID from [facility] [place] q 4 weeks for growth NST twice weekly or BPP q week to start at 32 weeks  ? Eclampsia 03/07/2013  ? First-degree perineal laceration, with delivery 03/12/2013  ? Hypertension   ? Taking Aldomet 250mg  TID  ? Palpitations   ? Pre-eclampsia added to pre-existing hypertension 03/07/2013  ? Vaginal delivery 03/12/2013  ? ? ?Past Surgical History:  ?Procedure Laterality Date  ? NO PAST SURGERIES    ?  ? ?Current Outpatient Medications on File Prior to Visit  ?Medication Sig Dispense Refill  ? amphetamine-dextroamphetamine (ADDERALL) 10 MG tablet Take 1 tablet (10 mg total) by mouth 2 (two) times daily. 60 tablet 0  ? Vitamin D, Ergocalciferol, (DRISDOL) 1.25 MG (50000 UNIT) CAPS capsule Take 1 capsule (50,000 Units total) by mouth every 7 (seven) days. Take for 8 total doses(weeks) 8 capsule 0  ? ?No current facility-administered medications on file prior to visit.  ? ? ?No Known Allergies ? ?Physical exam: ? ?Today's Vitals   ? 02/15/22 1006 02/15/22 1021  ?BP: (!) 164/116 (!) 159/113  ?Pulse: 90 92  ?Weight: 178 lb (80.7 kg)   ?Height: 5\' 7"  (1.702 m)   ? ?Body mass index is 27.88 kg/m?.  ? ?Wt Readings from Last 3 Encounters:  ?02/15/22 178 lb (80.7 kg)  ?02/15/22 178 lb 14.4 oz (81.1 kg)  ?10/13/21 170 lb (77.1 kg)  ?  ? ?Ht Readings from Last 3 Encounters:  ?02/15/22 5\' 7"  (1.702 m)  ?02/15/22 5\' 7"  (1.702 m)  ?10/13/21 5\' 7"  (1.702 m)  ?  ?  ?General: The patient is awake, alert and appears not in acute distress. The patient  is well groomed. ?Head: Normocephalic, atraumatic. Neck is supple.  ?Mallampati 3,  ?neck circumference:15 inches .  ?Nasal airflow is  patent.  Retrognathia is not  seen.  ?Dental status: no retainers,  biological teeth.  ?Cardiovascular:  Regular rate and cardiac rhythm by pulse,  without distended neck veins. ?Respiratory: Lungs are clear to auscultation.  ?Skin:  With evidence of ankle edema, hands are puffy, BP is up today.  ?Trunk: The patient's posture is erect. ?  ?Neurologic exam : ?The patient is awake and alert, oriented to place and time.   ?Memory subjective described as intact.  ?Attention span & concentration ability appears normal.  ?Speech is fluent,  without  dysarthria, dysphonia or aphasia.  ?Mood and affect are appropriate. ?  ?Cranial nerves: no loss of smell or taste reported  ?Pupils are equal and briskly reactive to light. Funduscopic exam deferred. Marland Kitchen  ?Extraocular movements in vertical and horizontal planes were intact and without nystagmus. No Diplopia. ?Visual fields by finger perimetry are intact. ?Hearing was intact to soft voice and finger rubbing.   ? Facial sensation intact to fine touch. ? Facial motor strength is symmetric and tongue and uvula move midline.  ?Neck ROM : rotation, tilt and flexion extension were normal for age and shoulder shrug was symmetrical.  ?  ?Motor exam:  Symmetric bulk, tone and ROM.   ?Normal tone without cog wheeling, symmetric grip strength . ?   ?Sensory:  Fine touch and vibration were tested  and  normal.  ?Proprioception tested in the upper extremities was normal. ?  ?Coordination: Rapid alternating movements in the fingers/hands were of normal s

## 2022-02-15 NOTE — Patient Instructions (Signed)
Sleep Apnea ?Sleep apnea affects breathing during sleep. It causes breathing to stop for 10 seconds or more, or to become shallow. People with sleep apnea usually snore loudly. ?It can also increase the risk of: ?Heart attack. ?Stroke. ?Being very overweight (obese). ?Diabetes. ?Heart failure. ?Irregular heartbeat. ?High blood pressure. ?The goal of treatment is to help you breathe normally again. ?What are the causes? ? ?The most common cause of this condition is a collapsed or blocked airway. ?There are three kinds of sleep apnea: ?Obstructive sleep apnea. This is caused by a blocked or collapsed airway. ?Central sleep apnea. This happens when the brain does not send the right signals to the muscles that control breathing. ?Mixed sleep apnea. This is a combination of obstructive and central sleep apnea. ?What increases the risk? ?Being overweight. ?Smoking. ?Having a small airway. ?Being older. ?Being female. ?Drinking alcohol. ?Taking medicines to calm yourself (sedatives or tranquilizers). ?Having family members with the condition. ?Having a tongue or tonsils that are larger than normal. ?What are the signs or symptoms? ?Trouble staying asleep. ?Loud snoring. ?Headaches in the morning. ?Waking up gasping. ?Dry mouth or sore throat in the morning. ?Being sleepy or tired during the day. ?If you are sleepy or tired during the day, you may also: ?Not be able to focus your mind (concentrate). ?Forget things. ?Get angry a lot and have mood swings. ?Feel sad (depressed). ?Have changes in your personality. ?Have less interest in sex, if you are female. ?Be unable to have an erection, if you are female. ?How is this treated? ? ?Sleeping on your side. ?Using a medicine to get rid of mucus in your nose (decongestant). ?Avoiding the use of alcohol, medicines to help you relax, or certain pain medicines (narcotics). ?Losing weight, if needed. ?Changing your diet. ?Quitting smoking. ?Using a machine to open your airway while you  sleep, such as: ?An oral appliance. This is a mouthpiece that shifts your lower jaw forward. ?A CPAP device. This device blows air through a mask when you breathe out (exhale). ?An EPAP device. This has valves that you put in each nostril. ?A BIPAP device. This device blows air through a mask when you breathe in (inhale) and breathe out. ?Having surgery if other treatments do not work. ?Follow these instructions at home: ?Lifestyle ?Make changes that your doctor recommends. ?Eat a healthy diet. ?Lose weight if needed. ?Avoid alcohol, medicines to help you relax, and some pain medicines. ?Do not smoke or use any products that contain nicotine or tobacco. If you need help quitting, ask your doctor. ?General instructions ?Take over-the-counter and prescription medicines only as told by your doctor. ?If you were given a machine to use while you sleep, use it only as told by your doctor. ?If you are having surgery, make sure to tell your doctor you have sleep apnea. You may need to bring your device with you. ?Keep all follow-up visits. ?Contact a doctor if: ?The machine that you were given to use during sleep bothers you or does not seem to be working. ?You do not get better. ?You get worse. ?Get help right away if: ?Your chest hurts. ?You have trouble breathing in enough air. ?You have an uncomfortable feeling in your back, arms, or stomach. ?You have trouble talking. ?One side of your body feels weak. ?A part of your face is hanging down. ?These symptoms may be an emergency. Get help right away. Call your local emergency services (911 in the [place] ?Do not wait to see if the   symptoms will go away. ?Do not drive yourself to the hospital. ?Summary ?This condition affects breathing during sleep. ?The most common cause is a collapsed or blocked airway. ?The goal of treatment is to help you breathe normally while you sleep. ?This information is not intended to replace advice given to you by your health care provider. Make  sure you discuss any questions you have with your health care provider. ?Document Revised: 04/27/2021 Document Reviewed: 08/27/2020 ?Elsevier Patient Education ? [place]. ?Screening for Sleep Apnea ? ?Sleep apnea is a condition in which breathing pauses or becomes shallow during sleep. Sleep apnea screening is a test to determine if you are at risk for sleep apnea. The test includes a series of questions. It will only takes a few minutes. Your health care provider may ask you to have this test in preparation for surgery or as part of a physical exam. ?What are the symptoms of sleep apnea? ?Common symptoms of sleep apnea include: ?Snoring. ?Waking up often at night. ?Daytime sleepiness. ?Pauses in breathing. ?Choking or gasping during sleep. ?Irritability. ?Forgetfulness. ?Trouble thinking clearly. ?Depression. ?Personality changes. ?Most people with sleep apnea do not know that they have it. ?What are the advantages of sleep apnea screening? ?Getting screened for sleep apnea can help: ?Ensure your safety. It is important for your health care providers to know whether or not you have sleep apnea, especially if you are having surgery or have other long-term (chronic) health conditions. ?Improve your health and allow you to get a better night's rest. Restful sleep can help you: ?Have more energy. ?Lose weight. ?Improve high blood pressure. ?Improve diabetes management. ?Prevent stroke. ?Prevent car accidents. ?What happens during the screening? ?Screening usually includes being asked a list of questions about your sleep quality. Some questions you may be asked include: ?Do you snore? ?Is your sleep restless? ?Do you have daytime sleepiness? ?Has a partner or spouse told you that you stop breathing during sleep? ?Have you had trouble concentrating or memory loss? ?What is your age? ?What is your neck circumference? ?To measure your neck, keep your back straight and gently wrap the tape measure around your  neck. Put the tape measure at the middle of your neck, between your chin and collarbone. ?What is your sex assigned at birth? ?Do you have or are you being treated for high blood pressure? ?If your screening test is positive, you are at risk for the condition. Further testing may be needed to confirm a diagnosis of sleep apnea. ?Where to find more information ?You can find screening tools online or at your health care clinic. For more information about sleep apnea screening and healthy sleep, visit these websites: ?Centers for Disease Control and Prevention: http://www.wolf.info/ ?American Sleep Apnea Association: www.sleepapnea.org ?Contact a health care provider if: ?You think that you may have sleep apnea. ?Summary ?Sleep apnea screening can help determine if you are at risk for sleep apnea. ?It is important for your health care providers to know whether or not you have sleep apnea, especially if you are having surgery or have other chronic health conditions. ?You may be asked to take a screening test for sleep apnea in preparation for surgery or as part of a physical exam. ?This information is not intended to replace advice given to you by your health care provider. Make sure you discuss any questions you have with your health care provider. ?Document Revised: 08/27/2020 Document Reviewed: 08/27/2020 ?Elsevier Patient Education ? [place]. ? ?

## 2022-02-15 NOTE — Patient Instructions (Addendum)
It was a pleasure seeing you today. I hope your time spent with [place] was pleasant and helpful. Please let us know if there is anything we can do to improve the service you receive.  ? ?Today we discussed concerns with: ? ?Hypertension ?Start labetalol 100mg  twice a day- if after 4 days BP is above 140/80 (one or both numbers) increase to 200mg  twice a day ?We will touch base in about 2 weeks with a phone call to see how your BP is looking ?We can start the testing for thalassemia- based on the initial labs, we may need to get more lab work done.  ?I would speak to your OB GYN, some genetic testing may be recommended ?I am sending in Nasonex nasal spray and cromolyn sodium eye drops to see if these help with the eye allergies ?I want you to wash your eyes in baby shampoo twice a day. I have sent in a steroid cream that you can use twice a day with a very thin layer to the affected areas. Do not use longer than 2 weeks.  ? ?The following orders have been placed for you today: ? ?No orders of the defined types were placed in this encounter. ? ? ? ?Important Office Information ?Lab Results ?If labs were ordered, please note that you will see results through MyChart as soon as they come available from [facility].  ?It takes up to 5 business days for the results to be routed to me and for me to review them once all of the lab results have come through from [facility]. I will make recommendations based on your results and send these through MyChart or someone from the office will call you to discuss. If your labs are abnormal, we may contact you to schedule a visit to discuss the results and make recommendations.  ?If you have not heard from within 5 business days or you have waited longer than a week and your lab results have not come through on MyChart, please feel free to call the office or send a message through MyChart to follow-up on these labs.  ? ?Referrals ?If referrals were placed today, the office where the referral  was sent will contact you either by phone or through MyChart to set up scheduling. Please note that it can take up to a week for the referral office to contact you. If you do not hear from them in a week, please contact the referral office directly to inquire about scheduling.  ? ?Condition Treated ?If your condition worsens or you begin to have new symptoms, please schedule a follow-up appointment for further evaluation. If you are not sure if an appointment is needed, you may call the office to leave a message for the nurse and someone will contact you with recommendations.  ?If you have an urgent or life threatening emergency, please do not call the office, but seek emergency evaluation by calling 911 or going to the nearest emergency room for evaluation.  ? ?MyChart and Phone Calls ?Please do not use MyChart for urgent messages. It may take up to 3 business days for MyChart messages to be read by staff and if they are unable to handle the request, an additional 3 business days for them to be routed to me and for my response.  ?Messages sent to the provider through MyChart do not come directly to the provider, please allow time for these messages to be routed and for me to respond.  ?We get a  large volume of MyChart messages daily and these are responded to in the order received.  ? ?For urgent messages, please call the office at [phone] and speak with the front office staff or leave a message on the line of my assistant for guidance.  ?We are seeing patients from the hours of 8:00 am through 5:00 pm and calls directly to the nurse may not be answered immediately due to seeing patients, but your call will be returned as soon as possible.  ?Phone  messages received after 4:00 PM Monday through Thursday may not be returned until the following business day. Phone messages received after 11:00 AM on Friday may not be returned until Monday.  ? ?After Hours ?We share on call hours with providers from other  offices. If you have an urgent need after hours that cannot wait until the next business day, please contact the on call provider by calling the office number. A nurse will speak with you and contact the provider if needed for recommendations.  ?If you have an urgent or life threatening emergency after hours, please do not call the on call provider, but seek emergency evaluation by calling 911 or going to the nearest emergency room for evaluation.  ? ?Paperwork ?All paperwork requires a minimum of 5 days to complete and return to you or the designated personnel. Please keep this in mind when bringing in forms or sending requests for paperwork completion to the office.  ?  ?

## 2022-02-15 NOTE — Progress Notes (Signed)
[name], DNP, AGNP-c [facility] [address] [place], [place] [address] [phone] Office [phone] Fax  ESTABLISHED PATIENT- Chronic Health and/or Follow-Up Visit  Blood pressure (!) 130/100, pulse 94, height 5\' 7"  (1.702 m), weight 178 lb 14.4 oz (81.1 kg), SpO2 98 %.  Hypertension and allergy   HPI  Veronica Griffin  is a 31 y.o. year old female presenting today for evaluation and management of the following: HTN Stopped BP medication about a month ago Trying to get pregnant No HA, dizziness, CP, vision changes, LE edema Would like to know options for BP control in pregnancy ADHD Stopped medication due to planning pregnancy Doing OK at this time without medications Anemia Mom and sister have anemia Sister has a blood disorder- thalassemia  No known self history Allergies Have had allergies entire life Eyes have recently started breaking out into a bumpy rash Noticing drainage in the AM with foggy vision and crusty eyes They are itchy during the day No bad rhinorrhea Sneezing is present  ROS All ROS negative with exception of what is listed in HPI  PHYSICAL EXAM Physical Exam Vitals and nursing note reviewed.  Constitutional:      Appearance: Normal appearance. She is normal weight.  HENT:     Head: Normocephalic.     Right Ear: Tympanic membrane normal.     Left Ear: Tympanic membrane normal.     Nose: No congestion.     Mouth/Throat:     Mouth: Mucous membranes are moist.     Pharynx: Oropharynx is clear.  Eyes:     General: Lids are normal. Vision grossly intact. Allergic shiner present.     Extraocular Movements: Extraocular movements intact.     Conjunctiva/sclera:     Right eye: Right conjunctiva is injected.     Left eye: Left conjunctiva is injected.     Pupils: Pupils are equal, round, and reactive to light.  Neck:     Vascular: No carotid bruit.  Cardiovascular:     Rate and  Rhythm: Normal rate and regular rhythm.     Pulses: Normal pulses.     Heart sounds: Normal heart sounds. No murmur heard. Pulmonary:     Effort: Pulmonary effort is normal.     Breath sounds: Normal breath sounds. No wheezing.  Abdominal:     General: Bowel sounds are normal.     Palpations: Abdomen is soft.  Musculoskeletal:        General: Normal range of motion.     Cervical back: Normal range of motion.     Right lower leg: No edema.     Left lower leg: No edema.  Skin:    General: Skin is warm and dry.     Capillary Refill: Capillary refill takes less than 2 seconds.  Neurological:     General: No focal deficit present.     Mental Status: She is alert.  Psychiatric:        Mood and Affect: Mood normal.        Behavior: Behavior normal.        Thought Content: Thought content normal.        Judgment: Judgment normal.    ASSESSMENT & PLAN Problem List Items Addressed This Visit     Hemoglobin E-beta thalassemia (HCC)    Chronic.  No alarm symptoms present today.  We will obtain labs for evaluation and monitoring.       Primary hypertension - Primary  Blood pressure not well controlled today. Patient is recently stopped taking her blood pressure medications as she has been trying to conceive. Discussed importance of blood pressure control during pregnancy to help prevent complications and healthy pregnancy outcomes. Discussed medication options that are recommended during pregnancy including labetalol.  We will plan to begin labetalol and recommend at home monitoring of blood pressures with goal blood pressure less than 130/80.  If blood pressures remain consistently high patient will notify the office and we will adjust medications.  We will plan to get labs today.  We will plan to follow-up in the next couple of weeks to ensure that blood pressures are well controlled.       Relevant Medications   labetalol (NORMODYNE) 100 MG tablet   Other Relevant Orders   CBC  with Differential/Platelet (Completed)   Comprehensive metabolic panel (Completed)   Lipid panel (Completed)   Iron, TIBC and Ferritin Panel (Completed)   VITAMIN D 25 Hydroxy (Vit-D Deficiency, Fractures) (Completed)   Hemoglobin A1c (Completed)   TSH (Completed)   B12 and Folate Panel (Completed)   Attention deficit hyperactivity disorder (ADHD), combined type    Chronic.  Doing well at this time.  Has recently stopped medication as she is trying to conceive.  Unclear at this time if conception has occurred. Encourage patient to continue to remain off of medication while she is actively trying to conceive and during pregnancy.  Once pregnancy has ended and she is no longer breast-feeding or she stops attempts for conception we can plan to restart her medication. Follow-up as needed.       Relevant Orders   CBC with Differential/Platelet (Completed)   Comprehensive metabolic panel (Completed)   Lipid panel (Completed)   Iron, TIBC and Ferritin Panel (Completed)   VITAMIN D 25 Hydroxy (Vit-D Deficiency, Fractures) (Completed)   Hemoglobin A1c (Completed)   TSH (Completed)   B12 and Folate Panel (Completed)   Vitamin D deficiency    We will obtain labs today for evaluation.  Patient is actively trying to conceive.       Relevant Orders   VITAMIN D 25 Hydroxy (Vit-D Deficiency, Fractures) (Completed)   Iron deficiency    Chronic.  Will obtain labs today for monitoring.  Patient is actively trying to conceive.       Relevant Orders   CBC with Differential/Platelet (Completed)   Comprehensive metabolic panel (Completed)   Lipid panel (Completed)   Iron, TIBC and Ferritin Panel (Completed)   VITAMIN D 25 Hydroxy (Vit-D Deficiency, Fractures) (Completed)   Hemoglobin A1c (Completed)   TSH (Completed)   B12 and Folate Panel (Completed)   Seasonal allergic rhinitis due to pollen    Chronic.  Not well controlled at this time.  Patient is trying to get pregnant at this time  therefore we will avoid oral antihistamine medications.  Recommend fluticasone nose spray for optimal management.  Recommend discussion with obstetrician if symptoms are not well controlled for further recommendations and evaluation and monitoring if she conceives.       Environmental and seasonal allergies    Symptoms consistent with seasonal allergies affecting the eyes bilaterally.  She is actively trying to conceive and has stopped all allergy medications.  Recommend fluticasone nasal spray for management.  We will also send cromolyn sodium eyedrops for eye symptoms as these are recommended safe options during pregnancy. Recommend close monitoring for new or worsening symptoms and follow-up if symptoms fail to improve.  Relevant Medications   cromolyn (OPTICROM) 4 % ophthalmic solution   hydrocortisone 1 % lotion   fluticasone (FLONASE) 50 MCG/ACT nasal spray   Other Visit Diagnoses     Screening for endocrine, nutritional, metabolic and immunity disorder       Relevant Orders   CBC with Differential/Platelet (Completed)   Comprehensive metabolic panel (Completed)   Lipid panel (Completed)   Iron, TIBC and Ferritin Panel (Completed)   VITAMIN D 25 Hydroxy (Vit-D Deficiency, Fractures) (Completed)   Hemoglobin A1c (Completed)   TSH (Completed)   B12 and Folate Panel (Completed)        FOLLOW-UP Return for 2 weeks phone call BP f/u.  [name], DNP, AGNP-c

## 2022-02-16 ENCOUNTER — Other Ambulatory Visit (HOSPITAL_COMMUNITY): Payer: Self-pay

## 2022-02-16 ENCOUNTER — Other Ambulatory Visit (HOSPITAL_BASED_OUTPATIENT_CLINIC_OR_DEPARTMENT_OTHER): Payer: Self-pay | Admitting: Nurse Practitioner

## 2022-02-16 DIAGNOSIS — F902 Attention-deficit hyperactivity disorder, combined type: Secondary | ICD-10-CM

## 2022-02-16 DIAGNOSIS — Z130 Encounter for screening for diseases of the blood and blood-forming organs and certain disorders involving the immune mechanism: Secondary | ICD-10-CM

## 2022-02-16 DIAGNOSIS — I10 Essential (primary) hypertension: Secondary | ICD-10-CM

## 2022-02-16 DIAGNOSIS — Z13220 Encounter for screening for lipoid disorders: Secondary | ICD-10-CM

## 2022-02-16 LAB — COMPREHENSIVE METABOLIC PANEL
ALT: 17 IU/L (ref 0–32)
AST: 12 IU/L (ref 0–40)
Albumin/Globulin Ratio: 1.7 (ref 1.2–2.2)
Albumin: 4.5 g/dL (ref 3.9–5.0)
Alkaline Phosphatase: 46 IU/L (ref 44–121)
BUN/Creatinine Ratio: 16 (ref 9–23)
BUN: 12 mg/dL (ref 6–20)
Bilirubin Total: 0.3 mg/dL (ref 0.0–1.2)
CO2: 20 mmol/L (ref 20–29)
Calcium: 9 mg/dL (ref 8.7–10.2)
Chloride: 105 mmol/L (ref 96–106)
Creatinine, Ser: 0.74 mg/dL (ref 0.57–1.00)
Globulin, Total: 2.6 g/dL (ref 1.5–4.5)
Glucose: 94 mg/dL (ref 70–99)
Potassium: 4.1 mmol/L (ref 3.5–5.2)
Sodium: 139 mmol/L (ref 134–144)
Total Protein: 7.1 g/dL (ref 6.0–8.5)
eGFR: 112 mL/min/{1.73_m2} (ref >59)

## 2022-02-16 LAB — IRON,TIBC AND FERRITIN PANEL
Ferritin: 39 ng/mL (ref 15–150)
Iron Saturation: 22 % (ref 15–55)
Iron: 83 ug/dL (ref 27–159)
Total Iron Binding Capacity: 381 ug/dL (ref 250–450)
UIBC: 298 ug/dL (ref 131–425)

## 2022-02-16 LAB — CBC WITH DIFFERENTIAL/PLATELET
Basophils Absolute: 0 10*3/uL (ref 0.0–0.2)
Basos: 1 %
EOS (ABSOLUTE): 0.1 10*3/uL (ref 0.0–0.4)
Eos: 2 %
Hematocrit: 39.3 % (ref 34.0–46.6)
Hemoglobin: 12.8 g/dL (ref 11.1–15.9)
Immature Grans (Abs): 0 10*3/uL (ref 0.0–0.1)
Immature Granulocytes: 0 %
Lymphocytes Absolute: 1.8 10*3/uL (ref 0.7–3.1)
Lymphs: 33 %
MCH: 24.3 pg — ABNORMAL LOW (ref 26.6–33.0)
MCHC: 32.6 g/dL (ref 31.5–35.7)
MCV: 75 fL — ABNORMAL LOW (ref 79–97)
Monocytes Absolute: 0.4 10*3/uL (ref 0.1–0.9)
Monocytes: 7 %
Neutrophils Absolute: 3.2 10*3/uL (ref 1.4–7.0)
Neutrophils: 57 %
Platelets: 363 10*3/uL (ref 150–450)
RBC: 5.26 x10E6/uL (ref 3.77–5.28)
RDW: 14.4 % (ref 11.7–15.4)
WBC: 5.6 10*3/uL (ref 3.4–10.8)

## 2022-02-16 LAB — LIPID PANEL
Chol/HDL Ratio: 3 ratio (ref 0.0–4.4)
Cholesterol, Total: 154 mg/dL (ref 100–199)
HDL: 52 mg/dL (ref >39)
LDL Chol Calc (NIH): 69 mg/dL (ref 0–99)
Triglycerides: 200 mg/dL — ABNORMAL HIGH (ref 0–149)
VLDL Cholesterol Cal: 33 mg/dL (ref 5–40)

## 2022-02-16 LAB — HEMOGLOBIN A1C
Est. average glucose Bld gHb Est-mCnc: 111 mg/dL
Hgb A1c MFr Bld: 5.5 % (ref 4.8–5.6)

## 2022-02-16 LAB — TSH: TSH: 2.82 u[IU]/mL (ref 0.450–4.500)

## 2022-02-16 LAB — B12 AND FOLATE PANEL
Folate: 11.7 ng/mL (ref >3.0)
Vitamin B-12: 589 pg/mL (ref 232–1245)

## 2022-02-16 LAB — VITAMIN D 25 HYDROXY (VIT D DEFICIENCY, FRACTURES): Vit D, 25-Hydroxy: 20.2 ng/mL — ABNORMAL LOW (ref 30.0–100.0)

## 2022-02-17 ENCOUNTER — Other Ambulatory Visit (HOSPITAL_COMMUNITY): Payer: Self-pay

## 2022-02-19 DIAGNOSIS — J3089 Other allergic rhinitis: Secondary | ICD-10-CM | POA: Insufficient documentation

## 2022-02-19 NOTE — Assessment & Plan Note (Signed)
Chronic.  Doing well at this time.  Has recently stopped medication as she is trying to conceive.  Unclear at this time if conception has occurred. Encourage patient to continue to remain off of medication while she is actively trying to conceive and during pregnancy.  Once pregnancy has ended and she is no longer breast-feeding or she stops attempts for conception we can plan to restart her medication. Follow-up as needed.

## 2022-02-19 NOTE — Assessment & Plan Note (Signed)
Chronic.  Not well controlled at this time.  Patient is trying to get pregnant at this time therefore we will avoid oral antihistamine medications.  Recommend fluticasone nose spray for optimal management.  Recommend discussion with obstetrician if symptoms are not well controlled for further recommendations and evaluation and monitoring if she conceives.

## 2022-02-19 NOTE — Assessment & Plan Note (Signed)
Chronic.  No alarm symptoms present today.  We will obtain labs for evaluation and monitoring.

## 2022-02-19 NOTE — Assessment & Plan Note (Signed)
Blood pressure not well controlled today. Patient is recently stopped taking her blood pressure medications as she has been trying to conceive. Discussed importance of blood pressure control during pregnancy to help prevent complications and healthy pregnancy outcomes. Discussed medication options that are recommended during pregnancy including labetalol.  We will plan to begin labetalol and recommend at home monitoring of blood pressures with goal blood pressure less than 130/80.  If blood pressures remain consistently high patient will notify the office and we will adjust medications.  We will plan to get labs today.  We will plan to follow-up in the next couple of weeks to ensure that blood pressures are well controlled.

## 2022-02-19 NOTE — Assessment & Plan Note (Signed)
Symptoms consistent with seasonal allergies affecting the eyes bilaterally.  She is actively trying to conceive and has stopped all allergy medications.  Recommend fluticasone nasal spray for management.  We will also send cromolyn sodium eyedrops for eye symptoms as these are recommended safe options during pregnancy. Recommend close monitoring for new or worsening symptoms and follow-up if symptoms fail to improve.

## 2022-02-19 NOTE — Assessment & Plan Note (Signed)
We will obtain labs today for evaluation.  Patient is actively trying to conceive.

## 2022-02-19 NOTE — Assessment & Plan Note (Signed)
Chronic.  Will obtain labs today for monitoring.  Patient is actively trying to conceive.

## 2022-02-22 ENCOUNTER — Other Ambulatory Visit (HOSPITAL_COMMUNITY): Payer: Self-pay

## 2022-02-22 NOTE — Telephone Encounter (Signed)
Left patient message to call me back regarding ADHD medication and trying to get pregnant.

## 2022-03-02 NOTE — Telephone Encounter (Signed)
Left multiple  messages for patient, provider is aware and will discuss at 03/03/22 visit.

## 2022-03-03 ENCOUNTER — Other Ambulatory Visit (HOSPITAL_COMMUNITY): Payer: Self-pay

## 2022-03-03 ENCOUNTER — Encounter (HOSPITAL_BASED_OUTPATIENT_CLINIC_OR_DEPARTMENT_OTHER): Payer: Self-pay | Admitting: Nurse Practitioner

## 2022-03-03 ENCOUNTER — Ambulatory Visit (INDEPENDENT_AMBULATORY_CARE_PROVIDER_SITE_OTHER): Payer: No Typology Code available for payment source | Admitting: Nurse Practitioner

## 2022-03-03 DIAGNOSIS — F902 Attention-deficit hyperactivity disorder, combined type: Secondary | ICD-10-CM

## 2022-03-03 DIAGNOSIS — I10 Essential (primary) hypertension: Secondary | ICD-10-CM

## 2022-03-03 MED ORDER — AMPHETAMINE-DEXTROAMPHETAMINE 10 MG PO TABS
10.0000 mg | ORAL_TABLET | Freq: Two times a day (BID) | ORAL | 0 refills | Status: DC
  Filled 2022-03-03: qty 60, 30d supply, fill #0

## 2022-03-03 NOTE — Progress Notes (Signed)
Virtual Visit Encounter telephone visit.   I connected with  Veronica Griffin on 03/03/22 at  8:10 AM EDT by secure audio telemedicine application. I verified that I am speaking with the correct person using two identifiers.   I introduced myself as a Publishing rights manager with the practice. The limitations of evaluation and management by telemedicine discussed with the patient and the availability of in person appointments. The patient expressed verbal understanding and consent to proceed.  Participating parties in this visit include: Myself and patient  The patient is: Patient Location: Home I am: Provider Location: Office/Clinic Subjective:    CC and HPI: Veronica Griffin is a 31 y.o. year old female presenting for follow up of HTN. Patient reports the following: At last appointment patient reported that she was actively trying to conceive and we transitioned off of her previous blood pressure medication and started labetalol in preparation for this. Today she tells me that she is in school and they are checking blood pressures routinely and her blood pressure has been elevated since changing the medication.  She also reports that she has had some mild swelling in her hands and her feet. Today at noon her blood pressure was 148/108. She denies headaches, chest pain, palpitations, shortness of breath, dizziness.  She does endorse some mild vision changes while reading her textbook. She is taking the medication on a daily basis without missing doses. She has not had any negative side effects from the medication.  ADHD At her last visit her ADHD medication was stopped as she reported that she was actively trying to conceive.   Since that time she tells me she has had an increasingly difficult time focusing in school and getting her work done on time. She is still actively trying to conceive and is monitoring her menstrual cycles and sexual patterns closely. She would like to restart her ADHD  medication with the understanding that she would stop the medication immediately upon positive pregnancy test.  Past medical history, Surgical history, Family history not pertinant except as noted below, Social history, Allergies, and medications have been entered into the medical record, reviewed, and corrections made.   Review of Systems:  All review of systems negative except what is listed in the HPI  Objective:    Alert and oriented x 4 Speaking in clear sentences with no shortness of breath. No distress.  Impression and Recommendations:    Problem List Items Addressed This Visit     Primary hypertension - Primary    Blood pressure currently not well controlled with transition to labetalol. We will plan to increase labetalol to 200 mg twice a day dosing.  Patient will monitor blood pressure daily.  If blood pressures remain consistently elevated after 3 to 4 days patient will increase dosing to 300 mg twice a day of labetalol.  She will continue to monitor her blood pressures daily.  If her blood pressures are consistently elevated after 3 to 4 days she will increase to 400 mg twice a day of labetalol. Once on the 400 mg twice a day dosing if blood pressures remain elevated (greater than 120/80) after 3 to 4 days she will reach out to let me know.  Otherwise we will plan to follow-up in about 2 weeks with a telephone call to see how she is doing on the medication and how she is tolerating it.  At that time we will plan to adjust the medication accordingly. Suspect that the slight increase in water retention is  related to elevated blood pressures.  No alarm symptoms present.  Once we have improved control of blood pressures I suspect these will resolve.       Relevant Medications   amphetamine-dextroamphetamine (ADDERALL) 10 MG tablet   Attention deficit hyperactivity disorder (ADHD), combined type    Chronic.  Previously well controlled on Adderall 5 to 10 mg once to twice per day as  needed.  Since stopping the medication in preparation for pregnancy she has had a significant increase in symptoms and difficulty functioning while in school.  Patient would like to restart medication at 5 mg dose until pregnancy is detected.  She is monitoring closely.  Discussed with patient that stimulant use is not generally recommended during pregnancy however there are instances where medication is required.  Recent studies show no statistically significant difference in cardiac or brain birth defects in infants born to mothers taking Adderall for ADHD.  I do feel it is reasonably safe for her to continue this medication at least until pregnancy is established.  We will send refill today for patient.  She will plan to stop the medication as soon as pregnancy is determined.       Relevant Medications   amphetamine-dextroamphetamine (ADDERALL) 10 MG tablet    orders and follow up as documented in EMR I discussed the assessment and treatment plan with the patient. The patient was provided an opportunity to ask questions and all were answered. The patient agreed with the plan and demonstrated an understanding of the instructions.   The patient was advised to call back or seek an in-person evaluation if the symptoms worsen or if the condition fails to improve as anticipated.  Follow-Up: 2 weeks phone call- end of day (after 1230)  I provided 26 minutes of non-face-to-face interaction with this non face-to-face encounter including intake, same-day documentation, and chart review.   Tollie Eth, NP , DNP, AGNP-c [facility] at [facility] [phone] (fax)

## 2022-03-03 NOTE — Assessment & Plan Note (Signed)
Chronic.  Previously well controlled on Adderall 5 to 10 mg once to twice per day as needed.  Since stopping the medication in preparation for pregnancy she has had a significant increase in symptoms and difficulty functioning while in school.  Patient would like to restart medication at 5 mg dose until pregnancy is detected.  She is monitoring closely.  Discussed with patient that stimulant use is not generally recommended during pregnancy however there are instances where medication is required.  Recent studies show no statistically significant difference in cardiac or brain birth defects in infants born to mothers taking Adderall for ADHD.  I do feel it is reasonably safe for her to continue this medication at least until pregnancy is established.  We will send refill today for patient.  She will plan to stop the medication as soon as pregnancy is determined.

## 2022-03-03 NOTE — Patient Instructions (Signed)
I want you to increase the labetalol to 200mg  twice a day.   Keep checking your blood pressure daily.  If your blood pressure is higher than 120/80 after 3-4 days on the increased dose, I want you to increase the labetalol to 300mg  twice a day.   Keep checking your blood pressure daily.  If your blood pressure is higher than 120/80 after 3-4 days on the increased dose, I want you to increase the labetalol to 400mg  twice a day.   Keep checking your blood pressure daily.  If your blood pressures are still higher than 120/80, please send me a message.   If your blood pressures stabilize, please send me a note to let me know what dose you are on.   I will call you in about 2 weeks to see how you are doing.

## 2022-03-03 NOTE — Assessment & Plan Note (Signed)
Blood pressure currently not well controlled with transition to labetalol. We will plan to increase labetalol to 200 mg twice a day dosing.  Patient will monitor blood pressure daily.  If blood pressures remain consistently elevated after 3 to 4 days patient will increase dosing to 300 mg twice a day of labetalol.  She will continue to monitor her blood pressures daily.  If her blood pressures are consistently elevated after 3 to 4 days she will increase to 400 mg twice a day of labetalol. Once on the 400 mg twice a day dosing if blood pressures remain elevated (greater than 120/80) after 3 to 4 days she will reach out to let me know.  Otherwise we will plan to follow-up in about 2 weeks with a telephone call to see how she is doing on the medication and how she is tolerating it.  At that time we will plan to adjust the medication accordingly. Suspect that the slight increase in water retention is related to elevated blood pressures.  No alarm symptoms present.  Once we have improved control of blood pressures I suspect these will resolve.

## 2022-03-14 ENCOUNTER — Other Ambulatory Visit (HOSPITAL_COMMUNITY): Payer: Self-pay

## 2022-03-16 ENCOUNTER — Telehealth: Payer: Self-pay | Admitting: Neurology

## 2022-03-16 NOTE — Telephone Encounter (Signed)
[facility] no auth req spoke to Ashley ref # [id number]. Patient is scheduled at [facility] for 04/09/22 at 8pm

## 2022-04-06 ENCOUNTER — Other Ambulatory Visit (HOSPITAL_COMMUNITY): Payer: Self-pay

## 2022-04-09 ENCOUNTER — Ambulatory Visit (INDEPENDENT_AMBULATORY_CARE_PROVIDER_SITE_OTHER): Payer: No Typology Code available for payment source | Admitting: Neurology

## 2022-04-09 DIAGNOSIS — R030 Elevated blood-pressure reading, without diagnosis of hypertension: Secondary | ICD-10-CM

## 2022-04-09 DIAGNOSIS — R0683 Snoring: Secondary | ICD-10-CM | POA: Diagnosis not present

## 2022-04-09 DIAGNOSIS — G4719 Other hypersomnia: Secondary | ICD-10-CM

## 2022-04-09 DIAGNOSIS — J301 Allergic rhinitis due to pollen: Secondary | ICD-10-CM

## 2022-04-09 DIAGNOSIS — R0689 Other abnormalities of breathing: Secondary | ICD-10-CM

## 2022-04-09 DIAGNOSIS — F519 Sleep disorder not due to a substance or known physiological condition, unspecified: Secondary | ICD-10-CM

## 2022-04-09 DIAGNOSIS — I10 Essential (primary) hypertension: Secondary | ICD-10-CM

## 2022-04-14 NOTE — Procedures (Signed)
PATIENT'S NAME:  Veronica Griffin, Veronica Griffin DOB:      16-Dec-1990      MR#:    [id number]     DATE OF RECORDING: 04/09/2022 REFERRING M.D.:  Enid Skeens, NP Study Performed:   Baseline Polysomnogram HISTORY: Veronica Griffin is a 31 -. Year- old [place] female patient, who was seen on 02/15/2022 for a Sleep Consult, referred from NP Early  for snoring concerns, daytime fatigue, decreased concentration, and witnessed sleep apnea. No prior Sleep Studies. Boyfriend has OSA and uses CPAP. She falls asleep, easily and any time. Wakes choking, gasping. 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 Frequent sinusitis, rhinitis in childhood/ allergies. Snoring for years.  1) Patient ran out of BP meds and presents with high diastolic pressure and edema. -  high BP for a 31 year old woman and the cause can be untreated OSA.  2) Snoring and excessive daytime sleepiness - this patient needs a sleep study to screen for apnea.  3)  hypersomnia in daytime going on since youth, at least since high school, and has affected school performance    The patient endorsed the Epworth Sleepiness Scale at 16 points.  FSS at 36/63 points. The patient's weight 178 pounds with a height of 67 (inches), resulting in a BMI of 28. kg/m2. The patient's neck circumference measured 15 inches.  CURRENT MEDICATIONS: Adderall, Opticrom, Drisdol, Microzide, hydrocortisone, Normodyne   PROCEDURE:  This is a multichannel digital polysomnogram utilizing the Somnostar 11.2 system.  Electrodes and sensors were applied and monitored per AASM Specifications.   EEG, EOG, Chin and Limb EMG, were sampled at 200 Hz.  ECG, Snore and Nasal Pressure, Thermal Airflow,  Respiratory Effort, CPAP Flow and Pressure, Oximetry was sampled at 50 Hz. Digital video and audio were recorded.      BASELINE STUDY: Lights Out was at 20:44 and Lights On at 04:59.  Total recording time (TRT) was 495.5 minutes, with a total sleep time (TST) of 368 minutes.   The patient's sleep latency was 111.5 minutes.  REM latency was 96.5 minutes.  The sleep efficiency was 74.3 %.     SLEEP ARCHITECTURE: WASO (Wake after sleep onset) was 16 minutes.  There were 4 minutes in Stage N1, 179.5 minutes Stage N2, 87 minutes Stage N3 and 97.5 minutes in Stage REM.  The percentage of Stage N1 was 1.1%, Stage N2 was 48.8%, Stage N3 was 23.6% and Stage R (REM sleep) was 26.5%.    RESPIRATORY ANALYSIS:  There were a total of 0 respiratory events:  0 obstructive apneas, 0 central apneas and 0 mixed apneas with a total of 0 apneas and an apnea index (AI) of 0 /hour. There were 0 hypopneas with a hypopnea index of 0 /hour.  There were many snoring related  arousals, RDI, not associated with drop in oxygen saturati9on nor with changes in heart rate.     The total APNEA/HYPOPNEA INDEX (AHI) was 0/hour.  0 events occurred in REM sleep and 0 events in NREM. The REM AHI was  0 /hour, versus a non-REM AHI of 0. The patient spent 143 minutes of total sleep time in the supine position and 225 minutes in non-supine.. The supine AHI was 0.0 versus a non-supine AHI of 0.0.  OXYGEN SATURATION & C02:  The Wake baseline 02 saturation was 99%, with  the lowest being 93%. Time spent below 89% saturation equaled 0 minutes.  The arousals were noted as: 44 were spontaneous, 0 were associated with PLMs, 0 were associated with respiratory events. The patient had a total of 0 Periodic Limb Movements Audio and video analysis did not show any abnormal or unusual movements, behaviors, phonations or vocalizations.    Snoring was noted on exhalation. EKG was in keeping with normal sinus rhythm (NSR).  IMPRESSION:  This study  does not find enough apnea or hypopneas to diagnose Obstructive Sleep Apnea(OSA) Mild Snoring   RECOMMENDATIONS:  Treatment of snoring by dental device. There is simply not enough AHI to justify a CPAP trial. Since the patient endorsed hypersomnia without sleep attacks, dream intrusion and cataplexy, I have not ordered an MSLT tod follow.      I certify that I have reviewed the entire raw data recording prior to the issuance of this report in accordance with the Standards of Accreditation of the American Academy of Sleep Medicine (AASM)   Melvyn Novas, MD Medical Director, [facility] at [facility]. Diplomat, Biomedical engineer of Neurology and Sleep Medicine (Neurology and Sleep Medicine)

## 2022-04-17 ENCOUNTER — Encounter: Payer: Self-pay | Admitting: Neurology

## 2022-05-22 ENCOUNTER — Ambulatory Visit (HOSPITAL_COMMUNITY)
Admission: EM | Admit: 2022-05-22 | Discharge: 2022-05-22 | Disposition: A | Discharge status: Home / Self Care | Payer: No Typology Code available for payment source
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
## 2022-05-23 ENCOUNTER — Emergency Department (HOSPITAL_BASED_OUTPATIENT_CLINIC_OR_DEPARTMENT_OTHER): Payer: No Typology Code available for payment source | Admitting: Radiology

## 2022-05-23 ENCOUNTER — Encounter (HOSPITAL_BASED_OUTPATIENT_CLINIC_OR_DEPARTMENT_OTHER): Payer: Self-pay | Admitting: Obstetrics and Gynecology

## 2022-05-23 ENCOUNTER — Emergency Department (HOSPITAL_BASED_OUTPATIENT_CLINIC_OR_DEPARTMENT_OTHER)
Admission: EM | Admit: 2022-05-23 | Discharge: 2022-05-23 | Disposition: A | Discharge status: Home / Self Care | Payer: No Typology Code available for payment source | Attending: Emergency Medicine | Admitting: Emergency Medicine

## 2022-05-23 ENCOUNTER — Ambulatory Visit
Admission: RE | Admit: 2022-05-23 | Discharge: 2022-05-23 | Disposition: A | Discharge status: Home / Self Care | Payer: No Typology Code available for payment source | Source: Ambulatory Visit | Attending: Nurse Practitioner | Admitting: Nurse Practitioner

## 2022-05-23 ENCOUNTER — Other Ambulatory Visit: Payer: Self-pay

## 2022-05-23 DIAGNOSIS — S40021A Contusion of right upper arm, initial encounter: Secondary | ICD-10-CM | POA: Insufficient documentation

## 2022-05-23 DIAGNOSIS — I10 Essential (primary) hypertension: Secondary | ICD-10-CM | POA: Diagnosis not present

## 2022-05-23 DIAGNOSIS — S161XXA Strain of muscle, fascia and tendon at neck level, initial encounter: Secondary | ICD-10-CM | POA: Insufficient documentation

## 2022-05-23 DIAGNOSIS — S199XXA Unspecified injury of neck, initial encounter: Secondary | ICD-10-CM | POA: Diagnosis present

## 2022-05-23 DIAGNOSIS — M7989 Other specified soft tissue disorders: Secondary | ICD-10-CM | POA: Insufficient documentation

## 2022-05-23 DIAGNOSIS — T148XXA Other injury of unspecified body region, initial encounter: Secondary | ICD-10-CM

## 2022-05-23 DIAGNOSIS — Z79899 Other long term (current) drug therapy: Secondary | ICD-10-CM | POA: Insufficient documentation
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
## 2022-05-23 NOTE — Discharge Instructions (Signed)
Take over-the-counter medications as needed for pain and discomfort.  Try applying ice to help with the swelling in the arm.  Keep your arm elevated when possible.  Your blood pressure was elevated today in the emergency room.  Continue your medications and follow-up with your doctor to have that rechecked
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
## 2022-05-31 ENCOUNTER — Other Ambulatory Visit (HOSPITAL_BASED_OUTPATIENT_CLINIC_OR_DEPARTMENT_OTHER): Payer: Self-pay | Admitting: Nurse Practitioner

## 2022-05-31 DIAGNOSIS — F902 Attention-deficit hyperactivity disorder, combined type: Secondary | ICD-10-CM

## 2022-05-31 DIAGNOSIS — I10 Essential (primary) hypertension: Secondary | ICD-10-CM

## 2022-06-02 ENCOUNTER — Other Ambulatory Visit (HOSPITAL_COMMUNITY): Payer: Self-pay

## 2022-06-06 ENCOUNTER — Encounter (HOSPITAL_BASED_OUTPATIENT_CLINIC_OR_DEPARTMENT_OTHER): Payer: Self-pay | Admitting: Nurse Practitioner

## 2022-06-07 ENCOUNTER — Other Ambulatory Visit (HOSPITAL_BASED_OUTPATIENT_CLINIC_OR_DEPARTMENT_OTHER): Payer: Self-pay

## 2022-06-07 ENCOUNTER — Other Ambulatory Visit (HOSPITAL_COMMUNITY): Payer: Self-pay

## 2022-06-07 DIAGNOSIS — I10 Essential (primary) hypertension: Secondary | ICD-10-CM

## 2022-06-07 MED ORDER — LABETALOL HCL 100 MG PO TABS
ORAL_TABLET | ORAL | 1 refills | Status: DC
  Filled 2022-06-07: qty 35, 9d supply, fill #0
  Filled 2022-06-07: qty 85, 21d supply, fill #0
  Filled 2022-07-17: qty 120, 30d supply, fill #1

## 2022-06-07 NOTE — Telephone Encounter (Signed)
Patient sent in request for Adderall refill. Please advise.

## 2022-06-09 MED ORDER — AMPHETAMINE-DEXTROAMPHETAMINE 10 MG PO TABS
10.0000 mg | ORAL_TABLET | Freq: Two times a day (BID) | ORAL | 0 refills | Status: DC
  Filled 2022-06-09: qty 8, 4d supply, fill #0
  Filled 2022-06-12: qty 52, 26d supply, fill #0

## 2022-06-12 ENCOUNTER — Other Ambulatory Visit (HOSPITAL_COMMUNITY): Payer: Self-pay

## 2022-06-15 ENCOUNTER — Other Ambulatory Visit (HOSPITAL_COMMUNITY): Payer: Self-pay

## 2022-06-20 ENCOUNTER — Encounter (HOSPITAL_BASED_OUTPATIENT_CLINIC_OR_DEPARTMENT_OTHER): Payer: Self-pay | Admitting: Nurse Practitioner

## 2022-06-21 ENCOUNTER — Telehealth (HOSPITAL_BASED_OUTPATIENT_CLINIC_OR_DEPARTMENT_OTHER): Payer: Self-pay

## 2022-06-21 NOTE — Telephone Encounter (Signed)
Patient sent FMLA through mychart. Paper was placed in providers in box.

## 2022-06-28 ENCOUNTER — Telehealth (HOSPITAL_BASED_OUTPATIENT_CLINIC_OR_DEPARTMENT_OTHER): Payer: Self-pay

## 2022-06-28 NOTE — Telephone Encounter (Signed)
More information is needed in order to complete the forms. Please call patient to request the highlighted information.

## 2022-06-28 NOTE — Telephone Encounter (Signed)
I was given verbal order from Sarabeth to call patient and complete FMLA paper work, once complete fax to [facility] for patient. It was faxed over with a conformation to [phone].

## 2022-06-29 ENCOUNTER — Other Ambulatory Visit: Payer: Self-pay | Admitting: Obstetrics and Gynecology

## 2022-06-29 ENCOUNTER — Other Ambulatory Visit (HOSPITAL_COMMUNITY)
Admission: RE | Admit: 2022-06-29 | Discharge: 2022-06-29 | Disposition: A | Discharge status: Home / Self Care | Payer: No Typology Code available for payment source | Source: Ambulatory Visit | Attending: Obstetrics and Gynecology | Admitting: Obstetrics and Gynecology

## 2022-06-29 DIAGNOSIS — D069 Carcinoma in situ of cervix, unspecified: Secondary | ICD-10-CM | POA: Diagnosis present

## 2022-06-29 NOTE — Telephone Encounter (Signed)
Forms where completed and faxed to [employer] per verbal order from [facility]

## 2022-07-04 LAB — CYTOLOGY - PAP
Comment: NEGATIVE
Comment: NEGATIVE
Comment: NEGATIVE
Diagnosis: UNDETERMINED — AB
HPV 16: POSITIVE — AB
HPV 18 / 45: NEGATIVE
High risk HPV: POSITIVE — AB

## 2022-07-11 ENCOUNTER — Telehealth (HOSPITAL_BASED_OUTPATIENT_CLINIC_OR_DEPARTMENT_OTHER): Payer: Self-pay | Admitting: Nurse Practitioner

## 2022-07-11 NOTE — Telephone Encounter (Signed)
Received FMLA  requesting additional information .  Placed in providers box

## 2022-07-17 ENCOUNTER — Other Ambulatory Visit (HOSPITAL_BASED_OUTPATIENT_CLINIC_OR_DEPARTMENT_OTHER): Payer: Self-pay | Admitting: Nurse Practitioner

## 2022-07-17 ENCOUNTER — Other Ambulatory Visit (HOSPITAL_COMMUNITY): Payer: Self-pay

## 2022-07-17 DIAGNOSIS — I10 Essential (primary) hypertension: Secondary | ICD-10-CM

## 2022-07-17 DIAGNOSIS — F902 Attention-deficit hyperactivity disorder, combined type: Secondary | ICD-10-CM

## 2022-07-18 ENCOUNTER — Other Ambulatory Visit (HOSPITAL_COMMUNITY): Payer: Self-pay

## 2022-07-19 ENCOUNTER — Other Ambulatory Visit (HOSPITAL_BASED_OUTPATIENT_CLINIC_OR_DEPARTMENT_OTHER): Payer: Self-pay | Admitting: Nurse Practitioner

## 2022-07-20 MED ORDER — AMPHETAMINE-DEXTROAMPHETAMINE 10 MG PO TABS
10.0000 mg | ORAL_TABLET | Freq: Two times a day (BID) | ORAL | 0 refills | Status: DC
  Filled 2022-07-20 – 2022-08-02 (×2): qty 60, 30d supply, fill #0

## 2022-07-21 ENCOUNTER — Other Ambulatory Visit (HOSPITAL_COMMUNITY): Payer: Self-pay

## 2022-08-01 ENCOUNTER — Other Ambulatory Visit (HOSPITAL_COMMUNITY): Payer: Self-pay

## 2022-08-02 ENCOUNTER — Other Ambulatory Visit (HOSPITAL_COMMUNITY): Payer: Self-pay

## 2022-08-28 ENCOUNTER — Other Ambulatory Visit (HOSPITAL_BASED_OUTPATIENT_CLINIC_OR_DEPARTMENT_OTHER): Payer: Self-pay | Admitting: Nurse Practitioner

## 2022-08-28 DIAGNOSIS — F902 Attention-deficit hyperactivity disorder, combined type: Secondary | ICD-10-CM

## 2022-08-28 DIAGNOSIS — I10 Essential (primary) hypertension: Secondary | ICD-10-CM

## 2022-08-28 MED ORDER — AMPHETAMINE-DEXTROAMPHETAMINE 10 MG PO TABS
10.0000 mg | ORAL_TABLET | Freq: Two times a day (BID) | ORAL | 0 refills | Status: DC
  Filled 2022-08-28 – 2022-08-30 (×2): qty 60, 30d supply, fill #0

## 2022-08-28 MED ORDER — LABETALOL HCL 100 MG PO TABS
ORAL_TABLET | ORAL | 1 refills | Status: DC
  Filled 2022-08-28: qty 120, 30d supply, fill #0
  Filled 2022-10-04: qty 120, 30d supply, fill #1

## 2022-08-29 ENCOUNTER — Other Ambulatory Visit (HOSPITAL_COMMUNITY): Payer: Self-pay

## 2022-08-30 ENCOUNTER — Other Ambulatory Visit (HOSPITAL_COMMUNITY): Payer: Self-pay

## 2022-09-21 ENCOUNTER — Other Ambulatory Visit: Payer: Self-pay | Admitting: Obstetrics and Gynecology

## 2022-10-04 ENCOUNTER — Other Ambulatory Visit (HOSPITAL_COMMUNITY): Payer: Self-pay

## 2022-10-04 ENCOUNTER — Other Ambulatory Visit: Payer: Self-pay

## 2022-10-04 ENCOUNTER — Other Ambulatory Visit (HOSPITAL_BASED_OUTPATIENT_CLINIC_OR_DEPARTMENT_OTHER): Payer: Self-pay | Admitting: Nurse Practitioner

## 2022-10-04 DIAGNOSIS — I10 Essential (primary) hypertension: Secondary | ICD-10-CM

## 2022-10-04 DIAGNOSIS — F902 Attention-deficit hyperactivity disorder, combined type: Secondary | ICD-10-CM

## 2022-10-04 MED ORDER — AMPHETAMINE-DEXTROAMPHETAMINE 10 MG PO TABS
10.0000 mg | ORAL_TABLET | Freq: Two times a day (BID) | ORAL | 0 refills | Status: DC
  Filled 2022-10-04: qty 60, 30d supply, fill #0

## 2022-10-10 ENCOUNTER — Encounter: Payer: Self-pay | Admitting: Internal Medicine

## 2022-10-17 DIAGNOSIS — D069 Carcinoma in situ of cervix, unspecified: Secondary | ICD-10-CM | POA: Diagnosis not present

## 2022-10-17 DIAGNOSIS — Z3169 Encounter for other general counseling and advice on procreation: Secondary | ICD-10-CM | POA: Diagnosis not present

## 2022-10-19 ENCOUNTER — Encounter: Payer: Self-pay | Admitting: Family Medicine

## 2022-10-19 ENCOUNTER — Ambulatory Visit (INDEPENDENT_AMBULATORY_CARE_PROVIDER_SITE_OTHER): Payer: 59 | Admitting: Family Medicine

## 2022-10-19 VITALS — BP 126/94 | HR 72 | Ht 66.5 in | Wt 179.6 lb

## 2022-10-19 DIAGNOSIS — I10 Essential (primary) hypertension: Secondary | ICD-10-CM

## 2022-10-19 DIAGNOSIS — F902 Attention-deficit hyperactivity disorder, combined type: Secondary | ICD-10-CM

## 2022-10-19 DIAGNOSIS — E559 Vitamin D deficiency, unspecified: Secondary | ICD-10-CM

## 2022-10-19 NOTE — Progress Notes (Signed)
Chief Complaint  Patient presents with   Hypertension    Had OB/Gyn appt and bp was 164/117. She has also been very fatigued. Has been having some chest tightness/soreness during and after high bp readings. Is afriad to take adderall due to high bp.    SaraBeth's patient presents for acute visit for high blood pressures. BP was very high at OB-GYN visit earlier this week. She is accompanied by her female partner. She reports having hypertension since she was in high school.  Last seen by PCP in June for med check. In May she had been switched to labetolol due to plans to conceive. BP was high at follow-up visit, dose was increased to 200mg  BID. Was to further titrate up the dose if BP's remained elevated. She states she increased the dose in September to 200mg  BID, and remains on this dose. BP's were 120-130/80's, so didn't titrate up further. She reports that she mainly checks BP when she has a headache.  Since December she has been having more frequent headaches, and ears have been hot (usually means BP is high). BP's have been higher, in the145/90's.  She has been on a clean diet for a long time. She admits that her diet is not as great since the holidays. Once a week has noodles (packaged, with only 1/2 the pack of seasoning). Rare fast food, occ chips. Some canned vegetables, "no sodium added" (not rinsing). Eating more steaks recently.  Some charcuterie boards over the holidays. 1 cup of coffee/d  Used to exercise 2 hours/day. Now is only active on the job, sometimes walks to the park with daughter, not strenuous. Some walking during lunch breaks. She notes some swelling of hands with walking, some numbness. Denies any lower extremity edema.  She denies side effects to the labetolol. She recalls that Amlodipine made her sleepy in the past (and atenolol prior to that). She does note some increased fatigue, sometimes goes straight to bed after work.  Has ADHD, had stopped med when trying to  conceive.  Was taking a while, so restarted at lower dose (5mg ), with plans to stop when pregnant. Uses adderall just prn, and none in the last week since BP high.  Vitamin D deficiency--last level 20.2 in May 2023. She was advised to take 2000 IU of D3 by SaraBeth. She has not been taking any vitamin D, nor any prenatal vitamins recently.   PMH, PSH, SH reviewed  Outpatient Encounter Medications as of 10/19/2022  Medication Sig Note   amphetamine-dextroamphetamine (ADDERALL) 10 MG tablet Take 1 tablet (10 mg total) by mouth 2 (two) times daily. 10/19/2022: Taking more as needed, last dose 10 days ago and is taking 5mg    labetalol (NORMODYNE) 100 MG tablet Start with 1 tablet (100mg ) by mouth twice daily. Monitor BP daily. If after 4 days your BP is >540 systolic and/or 80 diastolic increase dose to 2 tablets (200mg ) twice daily. 10/19/2022: Taking 200mg  BID   No facility-administered encounter medications on file as of 10/19/2022.   No Known Allergies  ROS:  no fever, chills, URI symptoms.  Currently denies any headache.  Feels some flutters or extra beats occasionally (in shower, not related to exercise). Denies dizziness, chest pain, shortness of breath. Intermittent fatigue per HPI. +snoring, reports had negative sleep study. No edema. No GI complaints, skin concerns.   PHYSICAL EXAM:   BP (!) 138/100   Pulse 72   Ht 5' 6.5" (1.689 m)   Wt 179 lb 9.6 oz (81.5 kg)  LMP 10/13/2022   BMI 28.55 kg/m   126/94 on repeat by MD  Wt Readings from Last 3 Encounters:  10/19/22 179 lb 9.6 oz (81.5 kg)  05/23/22 175 lb (79.4 kg)  02/15/22 178 lb (80.7 kg)   Well-appearing, pleasant female, in no distress HEENT: conjunctiva and sclera are clear, EOMI Neck: no lymphadenopathy, thyromegaly or bruit Heart: regular rate and rhythm, no murmur Lungs: clear bilaterally Abdomen: soft, nontender, no mass Extremities: no edema, normal pulses Psych: normal mood, affect, hygiene and  grooming Neuro: alert and oriented, cranial nerves grossly intact, normal gait   ASSESSMENT/PLAN:  Primary hypertension - Higher BP's recently, when diet not as good, higher Na intake. To monitor BP's send list of BP's--increase if stays above goal  Vitamin D deficiency - noncompliant with supplements--reminded her to restart 2000 IU daily. Can recheck level at next visit  Attention deficit hyperactivity disorder (ADHD), combined type - To monitor BP closely; may use low dose med if BP not too high, and monitoring. Reviewed risks with pregnancy  Patient and her boyfriend had many questions. Tried to answer all the best I could.  We spent a lot of time discussing her diet--which hasn't been as good recently, and likely accounts for the higher BP's and headaches. She needs to monitor her BP more regularly, not just when she has a headache. Given a BP log sheet and advised how to fill it out, to return it via MyChart within the next 1-2 weeks.  If BP's remain elevated, may need labetolol titrated up.  Hasn't been monitoring pulse--advised to record the pulse as well on her log sheet.  She had elevated TG in the past.  We reviewed healthy diets, avoiding processed foods, cutting back on red meats, low sodium diet. Discussed Mediterranean diet, some plant-based protein options. She was encouraged to increase exercise to a minimum of 150 mins/week (she was scared to exercise with higher BP's). Discussed her concern about hand swelling--this sounds very chronic, so not necessarily just from increased sodium intake. Reassured re: normal exam.  Advised to restart PNV and vit D3.  Recommended f/u with PCP for CPE (vs med check) in 2-3 months.  I spent 45 minutes dedicated to the care of this patient, including pre-visit review of records, face to face time, post-visit ordering of testing and documentation.   Your blood pressure was lower on recheck. Stress and sodium are likely contributing to  some of the higher blood pressures. Continue to monitor your blood pressure more regularly (and pulse), record them on the log, and send it to [place] in 1-2 weeks.  Goal blood pressure is under 130/80.  If it is running much higher, with pulses over 60's, then we can titrate up your labetolol.  We discussed a lot of dietary recommendations--mainly cutting the sodium out, as well as regular exercise, good sleep.  You are fine to continue your one cup of coffee/day. Use the adderall sparingly (1/2 tablet), if BP isn't sky high

## 2022-10-19 NOTE — Patient Instructions (Addendum)
  Your blood pressure was lower on recheck. Stress and sodium are likely contributing to some of the higher blood pressures. Continue to monitor your blood pressure more regularly (and pulse), record them on the log, and send it to [place] in 1-2 weeks.  Goal blood pressure is under 130/80.  If it is running much higher, with pulses over 60's, then we can titrate up your labetolol.  We discussed a lot of dietary recommendations--mainly cutting the sodium out, as well as regular exercise, good sleep.  You are fine to continue your one cup of coffee/day. Use the adderall sparingly (1/2 tablet), if BP isn't sky high  Restart taking vitamin D3 every day, get the 2000 IU dose. Restart prenatal vitamin

## 2022-11-03 DIAGNOSIS — Z3169 Encounter for other general counseling and advice on procreation: Secondary | ICD-10-CM | POA: Diagnosis not present

## 2022-11-06 ENCOUNTER — Other Ambulatory Visit: Payer: Self-pay | Admitting: Obstetrics and Gynecology

## 2022-11-06 ENCOUNTER — Encounter: Payer: Self-pay | Admitting: Nurse Practitioner

## 2022-11-06 DIAGNOSIS — Z3202 Encounter for pregnancy test, result negative: Secondary | ICD-10-CM | POA: Diagnosis not present

## 2022-11-06 DIAGNOSIS — D069 Carcinoma in situ of cervix, unspecified: Secondary | ICD-10-CM | POA: Diagnosis not present

## 2022-11-06 DIAGNOSIS — N870 Mild cervical dysplasia: Secondary | ICD-10-CM | POA: Diagnosis not present

## 2022-11-09 ENCOUNTER — Encounter: Payer: Self-pay | Admitting: Nurse Practitioner

## 2022-11-09 ENCOUNTER — Telehealth (INDEPENDENT_AMBULATORY_CARE_PROVIDER_SITE_OTHER): Payer: 59 | Admitting: Nurse Practitioner

## 2022-11-09 VITALS — Ht 66.0 in | Wt 180.0 lb

## 2022-11-09 DIAGNOSIS — E559 Vitamin D deficiency, unspecified: Secondary | ICD-10-CM

## 2022-11-09 DIAGNOSIS — R479 Unspecified speech disturbances: Secondary | ICD-10-CM

## 2022-11-09 DIAGNOSIS — H93233 Hyperacusis, bilateral: Secondary | ICD-10-CM

## 2022-11-09 DIAGNOSIS — R4701 Aphasia: Secondary | ICD-10-CM

## 2022-11-09 DIAGNOSIS — R4789 Other speech disturbances: Secondary | ICD-10-CM | POA: Insufficient documentation
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
## 2022-11-09 NOTE — Assessment & Plan Note (Signed)
Low vitamin D levels - Patient's previous labs showed low vitamin D levels, although this is not likely related to the speech and hearing issues. - Plan: Recommend over-the-counter vitamin D supplementation and encourage patient to spend time outdoors for natural sunlight exposure. Recheck vitamin D levels in 3-6 months.
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
## 2022-11-09 NOTE — Assessment & Plan Note (Signed)
Hearing disturbance (ringing and echoing) - Patient reports ringing and echoing in both ears, particularly when exposed to loud noises or sounds. This has been occurring for the past couple of months and is causing discomfort and frustration. - Plan: Discuss with neurology during the referral process to evaluate if this issue is related to the speech disorder or if a separate evaluation by an audiologist is needed. Advise patient to avoid loud noises and wait for neurology consultation before trying any earplugs or devices.

## 2022-11-14 ENCOUNTER — Encounter: Payer: Self-pay | Admitting: Nurse Practitioner

## 2022-11-16 ENCOUNTER — Other Ambulatory Visit (HOSPITAL_COMMUNITY): Payer: Self-pay

## 2022-11-16 ENCOUNTER — Other Ambulatory Visit (HOSPITAL_BASED_OUTPATIENT_CLINIC_OR_DEPARTMENT_OTHER): Payer: Self-pay | Admitting: Nurse Practitioner

## 2022-11-16 DIAGNOSIS — I10 Essential (primary) hypertension: Secondary | ICD-10-CM

## 2022-11-16 DIAGNOSIS — F902 Attention-deficit hyperactivity disorder, combined type: Secondary | ICD-10-CM

## 2022-11-16 MED ORDER — AMPHETAMINE-DEXTROAMPHETAMINE 10 MG PO TABS
10.0000 mg | ORAL_TABLET | Freq: Two times a day (BID) | ORAL | 0 refills | Status: DC
  Filled 2022-11-16: qty 60, 30d supply, fill #0

## 2022-11-16 MED ORDER — LABETALOL HCL 200 MG PO TABS
200.0000 mg | ORAL_TABLET | Freq: Two times a day (BID) | ORAL | 1 refills | Status: DC
  Filled 2022-11-16: qty 180, 90d supply, fill #0
  Filled 2023-02-12: qty 180, 90d supply, fill #1

## 2022-11-16 NOTE — Telephone Encounter (Signed)
Refill request last apt 11/09/22 next apt 11/28/22.

## 2022-11-28 ENCOUNTER — Other Ambulatory Visit (HOSPITAL_COMMUNITY): Payer: Self-pay

## 2022-11-28 ENCOUNTER — Encounter: Payer: Self-pay | Admitting: Nurse Practitioner

## 2022-11-28 ENCOUNTER — Ambulatory Visit: Payer: 59 | Admitting: Neurology

## 2022-11-28 ENCOUNTER — Encounter: Payer: Self-pay | Admitting: Neurology

## 2022-11-28 ENCOUNTER — Ambulatory Visit (INDEPENDENT_AMBULATORY_CARE_PROVIDER_SITE_OTHER): Payer: 59 | Admitting: Nurse Practitioner

## 2022-11-28 VITALS — BP 142/102 | HR 95 | Ht 67.0 in | Wt 179.0 lb

## 2022-11-28 VITALS — BP 120/80 | HR 81 | Ht 67.0 in | Wt 179.2 lb

## 2022-11-28 DIAGNOSIS — D582 Other hemoglobinopathies: Secondary | ICD-10-CM | POA: Diagnosis not present

## 2022-11-28 DIAGNOSIS — Z0000 Encounter for general adult medical examination without abnormal findings: Secondary | ICD-10-CM | POA: Diagnosis not present

## 2022-11-28 DIAGNOSIS — R1033 Periumbilical pain: Secondary | ICD-10-CM

## 2022-11-28 DIAGNOSIS — R4781 Slurred speech: Secondary | ICD-10-CM

## 2022-11-28 DIAGNOSIS — B001 Herpesviral vesicular dermatitis: Secondary | ICD-10-CM | POA: Diagnosis not present

## 2022-11-28 DIAGNOSIS — F902 Attention-deficit hyperactivity disorder, combined type: Secondary | ICD-10-CM | POA: Diagnosis not present

## 2022-11-28 DIAGNOSIS — E559 Vitamin D deficiency, unspecified: Secondary | ICD-10-CM | POA: Diagnosis not present

## 2022-11-28 DIAGNOSIS — N898 Other specified noninflammatory disorders of vagina: Secondary | ICD-10-CM | POA: Diagnosis not present

## 2022-11-28 DIAGNOSIS — Z9889 Other specified postprocedural states: Secondary | ICD-10-CM | POA: Diagnosis not present

## 2022-11-28 DIAGNOSIS — E611 Iron deficiency: Secondary | ICD-10-CM | POA: Diagnosis not present

## 2022-11-28 DIAGNOSIS — R4789 Other speech disturbances: Secondary | ICD-10-CM | POA: Diagnosis not present

## 2022-11-28 DIAGNOSIS — I10 Essential (primary) hypertension: Secondary | ICD-10-CM | POA: Diagnosis not present

## 2022-11-28 DIAGNOSIS — Z91414 Personal history of adult intimate partner abuse: Secondary | ICD-10-CM

## 2022-11-28 DIAGNOSIS — R7303 Prediabetes: Secondary | ICD-10-CM

## 2022-11-28 DIAGNOSIS — R479 Unspecified speech disturbances: Secondary | ICD-10-CM

## 2022-11-28 LAB — LIPID PANEL

## 2022-11-28 MED ORDER — FLUCONAZOLE 150 MG PO TABS
150.0000 mg | ORAL_TABLET | ORAL | 0 refills | Status: DC
  Filled 2022-11-28: qty 2, 2d supply, fill #0

## 2022-11-28 MED ORDER — METRONIDAZOLE 500 MG PO TABS
500.0000 mg | ORAL_TABLET | Freq: Two times a day (BID) | ORAL | 0 refills | Status: DC
  Filled 2022-11-28: qty 14, 7d supply, fill #0

## 2022-11-28 MED ORDER — VALACYCLOVIR HCL 1 G PO TABS
2000.0000 mg | ORAL_TABLET | Freq: Two times a day (BID) | ORAL | 2 refills | Status: DC
  Filled 2022-11-28: qty 4, 1d supply, fill #0
  Filled 2022-12-31: qty 4, 1d supply, fill #1

## 2022-11-28 MED ORDER — AMPHETAMINE-DEXTROAMPHETAMINE 10 MG PO TABS: 10.0000 mg | ORAL_TABLET | Freq: Two times a day (BID) | ORAL | 0 refills | Status: DC

## 2022-11-28 MED ORDER — AMPHETAMINE-DEXTROAMPHETAMINE 10 MG PO TABS
10.0000 mg | ORAL_TABLET | Freq: Two times a day (BID) | ORAL | 0 refills | Status: DC
  Filled 2022-12-21: qty 60, 30d supply, fill #0

## 2022-11-28 NOTE — Assessment & Plan Note (Signed)
Tenderness and sensitivity present the umbilicus. Chronic in nature with no clear etiology. Consider possible stretching of the fascia with no current herniation. No signs of inflammation or infection are present.  Plan: - Recommend watchful waiting approach - Symptoms of herniation discussed with patient.  - Consider additional imaging if changes present.

## 2022-11-28 NOTE — Addendum Note (Signed)
Addended by: Denario Bagot, Clarise Cruz E on: 11/28/2022 05:48 PM   Modules accepted: Level of Service
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
## 2022-11-28 NOTE — Patient Instructions (Signed)
Apraxia of speech aphasia,  dysarthria.   Referral for EEG, MRI Brain and for speech pathology.   Rv in 3-4 months.

## 2022-11-28 NOTE — Assessment & Plan Note (Signed)

## 2022-11-28 NOTE — Assessment & Plan Note (Signed)
>>  ASSESSMENT AND PLAN FOR EPISODES OF SPEECH ARREST WRITTEN ON 11/28/2022  5:28 PM BY Jawanda Passey E, NP  Patient reports improvement in speech issue since the episode that prompted initial appointment scheduling. Given that she is still having some symptoms, I do recommend that she continue with her neurology referral as planned.  Plan:  - Monitor and follow up with neurology as patient has an appointment scheduled. - Notify immediately if new or worsening symptoms present.

## 2022-11-28 NOTE — Assessment & Plan Note (Signed)
Blood pressure is well-controlled on current medication regimen. No alarm symptoms are present at this time.  - Plan:   - Continue current antihypertensive medications (labetalol) as prescribed (morning and night dosing).   - Monitor blood pressure at home and report if readings become elevated (greater than 130/85 consistently).

## 2022-11-28 NOTE — Progress Notes (Signed)
SLEEP MEDICINE CLINIC    Provider:  Larey Seat, MD  Primary Care Physician:  Orma Render, NP 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 She has noticed a couple times where she has episodes of drooling most from left side. Denies facial droop. 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 She has also been noticing sensitive with hearing/ringing in the ears for about a year        HISTORY OF PRESENT ILLNESS:   [id number]:  Veronica Griffin is a 32 y.o. year old  [place] female patient , she is seen on 11/28/2022 for a new problem following a video visit with her PC,  She reports slurred and stammering, she noticed these spells 3-4 times a week usually around midday. 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 She feels she knows what she wants to say and can't clearly and fluently pronounce it. The spell lasts 15 minutes.  Inhaler per referral note nurse practitioner Sarah early reports that the patient at her last visit complained about the spells occurring almost daily and particularly severe on the Monday prior to the video visit.  She also had to pause in process before correcting her speech and content herself.  The speech issue has been noticed by her daughter.  There has been no knowledge of hearing loss but she does state that she sometimes needs to hear something multiple times before processing it.  She has had ringing and echoing in both ears at this particular tinnitus is worse when exposed to loud noises. Drooling has occurred but  much less frequent in comparison to the speech impairment.  BP remains not well controlled, [id number] systolic. On labetalol.      She was last seen on 02-15-2022 from NP Early for a Sleep Consult.  the work up ended in a diagnosis of HYPERSOMNIA , with prolonged sleep time.    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 Frequent sinusitis, rhinitis in childhood/ allergies.  Snoring for years.   Sleep relevant medical history: none .     Family medical /sleep history: Father with OSA. 2 older sisters are snoring. Hypersomnia.    She feels she dreams all the time, and wakes up from her dreams. VIVID.  No sleep paralysis,  No cataplexy.    Review of Systems: Out of a complete 14 system review, the patient complains of only the following symptoms, and all other reviewed systems are negative.:  Fatigue, sleepiness , snoring, hypersomnia, gasping.  Palpitations  No diaphoresis.    How likely are you to doze in the following situations: 0 = not likely, 1 = slight chance, 2 = moderate chance, 3 = high chance   Sitting and  Reading? Watching Television? Sitting inactive in a public place (theater or meeting)? As a passenger in a car for an hour without a break? Lying down in the afternoon when circumstances permit? Sitting and talking to someone? Sitting quietly after lunch without alcohol? In a car, while stopped for a few minutes in traffic?   Total = 16/ 24 points   FSS endorsed at 36/ 63 points.   Slurred speech, stammering. Not witnessed by Boyfriend. He was aware of drooling.   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    Social Determinants of Health   Financial Resource Strain: Not on file  Food Insecurity: Not on file  Transportation Needs: Not on file  Physical Activity: Not on file  Stress: Not on file  Social Connections: Not on file    Family History  Problem Relation Age of Onset   Hypertension Mother    Hypertension Father    Hypertension Maternal Aunt     Past Medical History:  Diagnosis Date   Antepartum hemorrhage 10/08/2012   Had [place] in MAU at 8 weeks--large [facility]. Repeat US at 10 weeks at [facility]--viable IUP, [facility] noted.   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 10/10/2012   On Aldomet 250 mg po TID from [facility] [place] q 4 weeks for growth NST twice weekly or BPP q week to start at 32 weeks   Eclampsia 03/07/2013   First-degree perineal laceration, with delivery 03/12/2013   Hypertension    Taking Aldomet '250mg'$  TID   Palpitations    Pre-eclampsia added to pre-existing hypertension 03/07/2013   Vaginal delivery 03/12/2013    Past Surgical History:  Procedure Laterality Date   NO PAST SURGERIES       Current Outpatient Medications on File Prior to Visit  Medication Sig Dispense Refill   [START ON 01/09/2023] amphetamine-dextroamphetamine (ADDERALL) 10 MG tablet Take 1 tablet (10 mg total) by mouth 2 (two) times daily. 01/09/23 60 tablet 0   [START ON  12/12/2022] amphetamine-dextroamphetamine (ADDERALL) 10 MG tablet Take 1 tablet (10 mg total) by mouth 2 (two) times daily. 60 tablet 0   [START ON 02/06/2023] amphetamine-dextroamphetamine (ADDERALL) 10 MG tablet Take 1 tablet (10 mg total) by mouth 2 (two) times daily. 02/06/23 60 tablet 0   labetalol (NORMODYNE) 200 MG tablet Take 1 tablet (200 mg total) by mouth 2 (two) times daily. Note change in dose of tablet. 180 tablet 1   No current facility-administered medications on file prior to visit.    No Known Allergies  Physical exam:  Today's Vitals   11/28/22 1515  BP: (!) 142/102  Pulse: 95  Weight: 179 lb (81.2 kg)  Height: '5\' 7"'$  (1.702 m)  Body mass index is 28.04 kg/m.   Wt Readings from Last 3 Encounters:  11/28/22 179 lb (81.2 kg)  11/28/22 179 lb 3.2 oz (81.3 kg)  11/09/22 180 lb (81.6 kg)     Ht Readings from Last 3 Encounters:  11/28/22 '5\' 7"'$  (1.702 m)  11/28/22 '5\' 7"'$  (1.702 m)  11/09/22 '5\' 6"'$  (1.676 m)      General: The patient is awake, alert and appears not in acute distress. The patient is well groomed. Head: Normocephalic, atraumatic. Neck is supple.  Mallampati 3, clicking at TMJ.  neck circumference:15 inches .  Nasal airflow is  patent.  Retrognathia is not  seen.  Dental status: no retainers,  biological teeth.  Cardiovascular:  Regular rate and cardiac rhythm by pulse,  without distended neck veins. Respiratory: Lungs are clear to auscultation.  Skin:  With evidence of ankle edema, hands are puffy, BP is  again up today.  Trunk: The patient's posture is erect.   Neurologic exam : The patient is awake and alert, oriented to place and time.   Memory subjective described as intact.  Attention span & concentration ability appears normal.  Speech is fluent,  without  dysarthria, dysphonia or aphasia here .  Mood and affect are appropriate.   Cranial nerves: no loss of smell or taste reported  Pupils are equal and briskly reactive to light.  Funduscopic exam deferred.  Extraocular movements in vertical and horizontal planes were intact and without nystagmus. No Diplopia. Visual fields by finger perimetry are intact. Hearing was intact to soft voice and finger rubbing.    Facial sensation intact to fine touch.  Facial motor strength is symmetric and tongue and uvula move midline.  Neck ROM : rotation, tilt and flexion extension were normal for age and shoulder shrug was symmetrical.    Motor exam:  Symmetric bulk, tone and ROM.   Normal tone without cog wheeling, symmetric grip strength , not full strength .   Sensory:  Fine touch and vibration were intact .  Proprioception tested in the upper extremities was normal.   Coordination: Rapid alternating movements in the fingers/hands were of normal speed.  The Finger-to-nose maneuver was intact without evidence of ataxia, dysmetria or tremor.   Gait and station: Patient could rise unassisted from a seated position, walked without assistive device.  Stance is of normal width/ base and the patient turned with 3 steps.  Toe and heel walk were deferred.  Deep tendon reflexes: in the  upper and lower extremities are symmetric and intact.  Babinski response was deferred.       After spending a total time of 25 minutes: including  face to face and additional time for physical and neurologic examination, review of laboratory studies,  personal review of imaging studies, reports and results of other testing and review of referral information / records as far as provided in visit, I have established the following assessments:  Unsure what these spells may be.   1) the patient feels as if her tongue is too big. There is no fasciculation. The spells are happening around noon time,  last 15, max 30 minutes. and can manifest as neologism, using the wrong word, stammering, slurring or  abrupt speech arrest. She feels adderall helps her speech flow.  HTN is a problem that caused her to use  adderall only Prn.    I plan to obtain a MRI brain, EEG ,  and Speech Therapy consultation. This will rue out an organic cause  for speech slurring and arrest.  I consider the possibility of psychogenic manifestation, functional speech disorder .     I plan to follow up either personally or through our NP within 4 months post ST visit.  CC: I will share my notes with PCP.   Electronically signed by: Larey Seat, MD 11/28/2022 3:31 PM  [facility] Sleep Board certified by The [employer] of Sleep Medicine and Diplomate of the [employer] of Sleep Medicine. Board certified In Neurology through the [place], Fellow of the [employer] of Neurology. Medical Director of [employer].

## 2022-11-28 NOTE — Assessment & Plan Note (Signed)
She continues on low dose adderall for management of ADHD symptoms while in school and working. She did try to come off of this for a period of time, however, her attention and focus became difficult to manage. At this time she would like to continue on the medication. PDMP reviewed.  Plan: - continue ADHD medication at 5-'10mg'$  daily for ADHD management - stop medication immediately if pregnancy occurs
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
## 2022-11-28 NOTE — Assessment & Plan Note (Signed)
Patient reports improvement in speech issue since the episode that prompted initial appointment scheduling. Given that she is still having some symptoms, I do recommend that she continue with her neurology referral as planned.  Plan:  - Monitor and follow up with neurology as patient has an appointment scheduled. - Notify immediately if new or worsening symptoms present.

## 2022-11-29 ENCOUNTER — Telehealth: Payer: Self-pay | Admitting: Neurology

## 2022-11-29 LAB — CBC WITH DIFFERENTIAL/PLATELET
Basophils Absolute: 0 10*3/uL (ref 0.0–0.2)
Basos: 1 %
EOS (ABSOLUTE): 0.1 10*3/uL (ref 0.0–0.4)
Eos: 1 %
Hematocrit: 40.6 % (ref 34.0–46.6)
Hemoglobin: 13.5 g/dL (ref 11.1–15.9)
Immature Grans (Abs): 0 10*3/uL (ref 0.0–0.1)
Immature Granulocytes: 0 %
Lymphocytes Absolute: 1.9 10*3/uL (ref 0.7–3.1)
Lymphs: 35 %
MCH: 24.5 pg — ABNORMAL LOW (ref 26.6–33.0)
MCHC: 33.3 g/dL (ref 31.5–35.7)
MCV: 74 fL — ABNORMAL LOW (ref 79–97)
Monocytes Absolute: 0.4 10*3/uL (ref 0.1–0.9)
Monocytes: 8 %
Neutrophils Absolute: 2.9 10*3/uL (ref 1.4–7.0)
Neutrophils: 55 %
Platelets: 402 10*3/uL (ref 150–450)
RBC: 5.5 x10E6/uL — ABNORMAL HIGH (ref 3.77–5.28)
RDW: 13.9 % (ref 11.7–15.4)
WBC: 5.3 10*3/uL (ref 3.4–10.8)

## 2022-11-29 LAB — HEMOGLOBIN A1C
Est. average glucose Bld gHb Est-mCnc: 123 mg/dL
Hgb A1c MFr Bld: 5.9 % — ABNORMAL HIGH (ref 4.8–5.6)

## 2022-11-29 LAB — COMPREHENSIVE METABOLIC PANEL
ALT: 29 IU/L (ref 0–32)
AST: 16 IU/L (ref 0–40)
Albumin/Globulin Ratio: 1.8 (ref 1.2–2.2)
Albumin: 5.2 g/dL — ABNORMAL HIGH (ref 3.9–4.9)
Alkaline Phosphatase: 45 IU/L (ref 44–121)
BUN/Creatinine Ratio: 21 (ref 9–23)
BUN: 15 mg/dL (ref 6–20)
Bilirubin Total: 0.5 mg/dL (ref 0.0–1.2)
CO2: 21 mmol/L (ref 20–29)
Calcium: 9.7 mg/dL (ref 8.7–10.2)
Chloride: 101 mmol/L (ref 96–106)
Creatinine, Ser: 0.73 mg/dL (ref 0.57–1.00)
Globulin, Total: 2.9 g/dL (ref 1.5–4.5)
Glucose: 97 mg/dL (ref 70–99)
Potassium: 4.7 mmol/L (ref 3.5–5.2)
Sodium: 139 mmol/L (ref 134–144)
Total Protein: 8.1 g/dL (ref 6.0–8.5)
eGFR: 113 mL/min/{1.73_m2} (ref >59)

## 2022-11-29 LAB — LIPID PANEL
Chol/HDL Ratio: 3.7 ratio (ref 0.0–4.4)
Cholesterol, Total: 167 mg/dL (ref 100–199)
HDL: 45 mg/dL (ref >39)
LDL Chol Calc (NIH): 100 mg/dL — ABNORMAL HIGH (ref 0–99)
Triglycerides: 121 mg/dL (ref 0–149)
VLDL Cholesterol Cal: 22 mg/dL (ref 5–40)

## 2022-11-29 LAB — B12 AND FOLATE PANEL
Folate: 11.9 ng/mL (ref >3.0)
Vitamin B-12: 603 pg/mL (ref 232–1245)

## 2022-11-29 LAB — VITAMIN D 25 HYDROXY (VIT D DEFICIENCY, FRACTURES): Vit D, 25-Hydroxy: 21.6 ng/mL — ABNORMAL LOW (ref 30.0–100.0)

## 2022-11-29 LAB — IRON,TIBC AND FERRITIN PANEL
Ferritin: 71 ng/mL (ref 15–150)
Iron Saturation: 19 % (ref 15–55)
Iron: 82 ug/dL (ref 27–159)
Total Iron Binding Capacity: 432 ug/dL (ref 250–450)
UIBC: 350 ug/dL (ref 131–425)

## 2022-11-29 NOTE — Telephone Encounter (Signed)
[employer] sent to [facility] they obtain auth [id number]

## 2022-12-04 ENCOUNTER — Ambulatory Visit: Payer: 59 | Admitting: Neurology

## 2022-12-04 DIAGNOSIS — R4789 Other speech disturbances: Secondary | ICD-10-CM

## 2022-12-04 DIAGNOSIS — R479 Unspecified speech disturbances: Secondary | ICD-10-CM

## 2022-12-04 DIAGNOSIS — R4701 Aphasia: Secondary | ICD-10-CM

## 2022-12-04 DIAGNOSIS — F519 Sleep disorder not due to a substance or known physiological condition, unspecified: Secondary | ICD-10-CM

## 2022-12-04 DIAGNOSIS — R4781 Slurred speech: Secondary | ICD-10-CM

## 2022-12-05 ENCOUNTER — Ambulatory Visit: Payer: 59 | Admitting: Neurology

## 2022-12-05 NOTE — Addendum Note (Signed)
Addended by: Toriann Spadoni, Clarise Cruz E on: 12/05/2022 07:38 AM   Modules accepted: Orders

## 2022-12-07 ENCOUNTER — Other Ambulatory Visit (HOSPITAL_COMMUNITY): Payer: Self-pay

## 2022-12-07 ENCOUNTER — Encounter: Payer: Self-pay | Admitting: Nurse Practitioner

## 2022-12-07 ENCOUNTER — Other Ambulatory Visit: Payer: Self-pay

## 2022-12-07 DIAGNOSIS — E559 Vitamin D deficiency, unspecified: Secondary | ICD-10-CM

## 2022-12-07 DIAGNOSIS — R479 Unspecified speech disturbances: Secondary | ICD-10-CM | POA: Insufficient documentation

## 2022-12-07 DIAGNOSIS — R4781 Slurred speech: Secondary | ICD-10-CM | POA: Insufficient documentation

## 2022-12-07 MED ORDER — VITAMIN D (ERGOCALCIFEROL) 1.25 MG (50000 UNIT) PO CAPS
50000.0000 [IU] | ORAL_CAPSULE | ORAL | 0 refills | Status: DC
  Filled 2022-12-07: qty 12, 84d supply, fill #0

## 2022-12-07 NOTE — Progress Notes (Signed)
Abnormal EEG finding, follow up with prolonged ambulatory EEG to be read by Dr. April Manson. Pod 3 -please send referral for ambulatory EEG

## 2022-12-07 NOTE — Progress Notes (Signed)
[facility]  EEG (ELECTROENCEPHALOGRAM) REPORT     ORDERING CLINICIAN: Larey Seat, M.D.  TECHNOLOGIST: Wyman Songster, REEGT TECHNIQUE:  This Electroencephalogram was recorded utilizing the international standard 10-20 system of lead placement and reformatted into average and bipolar montages.  A single ECG electrode is placed to detect heart rate and rhythm. The patient is observed by the attending technologist, the video and audio can not be recorded.   STUDY DATE:   PATIENT NAME:  Recoding duration : 24.13 Activation included:  yes/Photic stimulation yes/ Hyperventilation .      Description: The EEG's posterior dominant background rhythm of 9 hertz was symmetrically displayed while the patient's eyes were closed  and amplitude promptly attenuated with eye opening.  Photic stimulation was initiated at frequencies from 3- through 21 hertz, resulting in photic entrainment at  5 through 15 hz - without periodic or rhythmic discharges, or epileptiform activity.  Hyperventilation maneuver was initiated: this maneuver let to amplitude build-up,  not leading to slowing.  Following hyperventilation the patient's EEG was reviewed for a period of 1 and 2 minutes post maneuver, and  periodic intermittent slowing  , symmetric with 2-3 sec duration was evident.  There was no sleep onset. These transient episodes did not lead to any physical changes.    IMPRESSION:  This EEG showed unusual episodic and transient high amplitude slowing post HV maneuver. The patient will need a follow up prolonged EEG recording, such as an ambulatory EEG .    Dr. Larey Seat, M.D. Accredited by the ABPN, [place].

## 2022-12-07 NOTE — Procedures (Signed)
[facility]  EEG (ELECTROENCEPHALOGRAM) REPORT     ORDERING CLINICIAN: Larey Seat, M.D.  TECHNOLOGIST: Wyman Songster, REEGT TECHNIQUE:  This Electroencephalogram was recorded utilizing the international standard 10-20 system of lead placement and reformatted into average and bipolar montages.  A single ECG electrode is placed to detect heart rate and rhythm. The patient is observed by the attending technologist, the video and audio can not be recorded.   STUDY DATE:   PATIENT NAME:  Recoding duration : 24.13 Activation included:  yes/Photic stimulation yes/ Hyperventilation .      Description: The EEG's posterior dominant background rhythm of 9 hertz was symmetrically displayed while the patient's eyes were closed  and amplitude promptly attenuated with eye opening.  Photic stimulation was initiated at frequencies from 3- through 21 hertz, resulting in photic entrainment at  5 through 15 hz - without periodic or rhythmic discharges, or epileptiform activity.  Hyperventilation maneuver was initiated: this maneuver let to amplitude build-up,  not leading to slowing.  Following hyperventilation the patient's EEG was reviewed for a period of 1 and 2 minutes post maneuver, and  periodic intermittent slowing  , symmetric with 2-3 sec duration was evident.  There was no sleep onset. These transient episodes did not lead to any physical changes.    IMPRESSION:  This EEG showed unusual episodic and transient high amplitude slowing post HV maneuver. This finding can be related to relaxation after HV.   The patient will need a follow up prolonged EEG recording, such as an ambulatory EEG .    Dr. Larey Seat, M.D. Accredited by the ABPN, [place].

## 2022-12-12 ENCOUNTER — Encounter: Payer: Self-pay | Admitting: Neurology

## 2022-12-12 DIAGNOSIS — R4781 Slurred speech: Secondary | ICD-10-CM

## 2022-12-12 DIAGNOSIS — R0683 Snoring: Secondary | ICD-10-CM

## 2022-12-12 DIAGNOSIS — R4789 Other speech disturbances: Secondary | ICD-10-CM

## 2022-12-12 DIAGNOSIS — R479 Unspecified speech disturbances: Secondary | ICD-10-CM

## 2022-12-18 ENCOUNTER — Ambulatory Visit
Admission: RE | Admit: 2022-12-18 | Discharge: 2022-12-18 | Disposition: A | Discharge status: Home / Self Care | Payer: 59 | Source: Ambulatory Visit | Attending: Neurology | Admitting: Neurology

## 2022-12-18 DIAGNOSIS — R479 Unspecified speech disturbances: Secondary | ICD-10-CM | POA: Diagnosis not present

## 2022-12-18 DIAGNOSIS — R4781 Slurred speech: Secondary | ICD-10-CM

## 2022-12-18 DIAGNOSIS — R4789 Other speech disturbances: Secondary | ICD-10-CM

## 2022-12-18 MED ORDER — GADOPICLENOL 0.5 MMOL/ML IV SOLN
9.0000 mL | Freq: Once | INTRAVENOUS | Status: AC | PRN
Start: 2022-12-18 — End: 2022-12-18
  Administered 2022-12-18: 9 mL via INTRAVENOUS

## 2022-12-19 ENCOUNTER — Encounter: Payer: Self-pay | Admitting: Neurology

## 2022-12-21 ENCOUNTER — Other Ambulatory Visit: Payer: Self-pay | Admitting: Neurology

## 2022-12-21 ENCOUNTER — Other Ambulatory Visit (HOSPITAL_COMMUNITY): Payer: Self-pay

## 2022-12-21 ENCOUNTER — Other Ambulatory Visit: Payer: Self-pay

## 2022-12-21 ENCOUNTER — Ambulatory Visit: Payer: 59 | Admitting: Neurology

## 2022-12-21 ENCOUNTER — Encounter: Payer: Self-pay | Admitting: Neurology

## 2022-12-21 VITALS — BP 134/94 | HR 80 | Ht 67.0 in | Wt 178.0 lb

## 2022-12-21 DIAGNOSIS — D565 Hemoglobin E-beta thalassemia: Secondary | ICD-10-CM

## 2022-12-21 DIAGNOSIS — R4789 Other speech disturbances: Secondary | ICD-10-CM | POA: Diagnosis not present

## 2022-12-21 DIAGNOSIS — R4781 Slurred speech: Secondary | ICD-10-CM

## 2022-12-21 DIAGNOSIS — G2581 Restless legs syndrome: Secondary | ICD-10-CM | POA: Diagnosis not present

## 2022-12-21 MED ORDER — LEVETIRACETAM 500 MG PO TABS
500.0000 mg | ORAL_TABLET | Freq: Two times a day (BID) | ORAL | 5 refills | Status: DC
  Filled 2022-12-21: qty 60, 30d supply, fill #0
  Filled 2023-01-18: qty 60, 30d supply, fill #1

## 2022-12-21 NOTE — Progress Notes (Signed)
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 She has noticed a couple times where she has episodes of drooling most from left side. Denies facial droop. She works as a Horticulturist, commercial sometimes when she is talking  her speech can become slurred or has to slow down and so she has gotten to wear she slows down to talk. She has also been noticing sensitive with hearing/ringing in the ears for about a year        HISTORY OF PRESENT ILLNESS:    RV 12-22-2022: we are meeting after EEG and MRI brain study, Sleep study negative for apnea.  EEG was normal- it showed spindleform activity   Reported speech arrest and RLS are possibly explained by the findings a in the MRI and their location near the [facility]. She also reports RLS.  This is not every night needed and we dicussed prn meds, she describes hip pain as a trigger - and only 2-3 times a months. This affects both legs. Prn magnesium. Hot shower before bedtime.  No ferritin abnormality.      MRI report: We also looked at the MRI together, I pointed out the vascular abnormality, no bleed AVM .   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  There is no evidence  of bleed. Couple punctate T2/FLAIR hyperintense foci in the subcortical white matter of the frontal lobes.  This is a nonspecific finding and is usually asymptomatic.  This can be seen as sequela of migraine headache or minimal chronic microvascular ischemic change.    [id number]:  Veronica Griffin is a 32 y.o. year old  [facility] female patient , she is seen on 12/21/2022 for a new problem following a video visit with her PC,  She reports slurred and stammering, she noticed these spells 3-4 times a week usually around midday. 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 She feels she knows what she wants to say and can't clearly and fluently pronounce it. The spell lasts 15 minutes.  Inhaler per referral note nurse practitioner Veronica Griffin early reports that the patient at her last visit complained about the spells occurring almost daily and particularly severe on the Monday prior to the video visit.  She also had to pause in process before correcting her speech and content herself.  The speech issue has been noticed by her daughter.  There has been no knowledge of hearing loss but she does state that she sometimes needs to hear something multiple times before processing it.  She has had ringing and echoing in both ears at this particular tinnitus is  worse when exposed to loud noises. Drooling has occurred but much less frequent in comparison to the speech impairment.  BP remains not well controlled, [id number] systolic. On labetalol.      She was last seen on 02-15-2022 from [facility] for a Sleep Consult.  the work up ended in a diagnosis of HYPERSOMNIA , with prolonged sleep time.    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 Frequent sinusitis, rhinitis in childhood/ allergies.  Snoring for years.   Sleep relevant medical history: none .     Family medical /sleep history: Father with OSA. 2 older sisters are snoring. Hypersomnia.    She feels she dreams all the time, and wakes up from her dreams. VIVID.  No sleep paralysis,  No cataplexy.    Review of Systems: Out of a complete 14 system review, the patient complains of only the following symptoms, and all other reviewed systems are negative.:  Fatigue, sleepiness , snoring, hypersomnia, gasping.  Palpitations  No diaphoresis.    How likely are you to doze in the following situations: 0 = not likely, 1 = slight chance, 2 = moderate chance, 3 = high chance   Sitting and Reading? Watching Television? Sitting inactive in a public place (theater or meeting)? As a passenger in a car for an hour without a break? Lying down in the afternoon when circumstances permit? Sitting and talking to someone? Sitting quietly after lunch without alcohol? In a car, while stopped for a few minutes in traffic?   Total = 16/ 24 points   FSS endorsed at 36/ 63 points.   Slurred speech, stammering. Not witnessed by Boyfriend. He was aware of drooling.   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     Social Determinants of Health   Financial Resource Strain: Not on file  Food Insecurity: Not on file  Transportation Needs: Not on file  Physical Activity: Not on file  Stress: Not on file  Social Connections: Not on file    Family History  Problem Relation Age of Onset   Hypertension Mother    Hypertension Father    Hypertension Maternal Aunt     Past Medical History:  Diagnosis Date   Antepartum hemorrhage 10/08/2012   Had [place] in MAU at 8 weeks--large [facility]. Repeat US at 10 weeks at [facility]--viable IUP, [facility] noted.   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 10/10/2012   On Aldomet 250 mg po TID from [facility] [place] q  4 weeks for growth NST twice weekly or BPP q week to start at 32 weeks   Diastolic blood pressure 90 mm Hg or higher 02/15/2022   Eclampsia 03/07/2013   First-degree perineal laceration, with delivery 03/12/2013   Gasping for breath 02/15/2022   Habitual snoring 10/13/2021   Hypertension    Taking Aldomet 250mg  TID   Palpitations    Pre-eclampsia added to pre-existing hypertension 03/07/2013   Vaginal delivery 03/12/2013    Past Surgical History:  Procedure Laterality Date   NO PAST SURGERIES       Current Outpatient Medications on File Prior to Visit  Medication Sig Dispense Refill   amphetamine-dextroamphetamine (ADDERALL) 10 MG tablet Take 1 tablet (10 mg total) by mouth 2 (two) times daily. 60 tablet 0   labetalol (NORMODYNE) 200 MG tablet Take 1 tablet (200 mg total) by mouth 2 (two) times daily. Note change in dose of tablet. 180 tablet 1   valACYclovir (VALTREX) 1000 MG tablet Take 2 tablets (2,000 mg total) by mouth 2 (two) times daily for 1 day. 4 tablet 2   Vitamin D, Ergocalciferol, (DRISDOL) 1.25 MG (50000 UNIT) CAPS capsule Take 1 capsule (50,000 Units total) by mouth every 7 (seven) days. Take for 12 total doses(weeks) 12 capsule 0   No current facility-administered medications on file  prior to visit.    No Known Allergies  Physical exam:  Today's Vitals   12/21/22 1030  BP: (!) 134/94  Pulse: 80  Weight: 178 lb (80.7 kg)  Height: 5\' 7"  (1.702 m)   Body mass index is 27.88 kg/m.   Wt Readings from Last 3 Encounters:  12/21/22 178 lb (80.7 kg)  11/28/22 179 lb (81.2 kg)  11/28/22 179 lb 3.2 oz (81.3 kg)     Ht Readings from Last 3 Encounters:  12/21/22 5\' 7"  (1.702 m)  11/28/22 5\' 7"  (1.702 m)  11/28/22 5\' 7"  (1.702 m)      General: The patient is awake, alert and appears not in acute distress. The patient is well groomed. Head: Normocephalic, atraumatic. Neck is supple.  Mallampati 3, clicking at TMJ.  neck circumference:15 inches .  Nasal airflow is  patent.  Retrognathia is not  seen.  Dental status: no retainers,  biological teeth.  Cardiovascular:  Regular rate and cardiac rhythm by pulse,  without distended neck veins. Respiratory: Lungs are clear to auscultation.  Skin:  With evidence of ankle edema, hands are puffy, BP is  again up today.  Trunk: The patient's posture is erect.   Neurologic exam : The patient is awake and alert, oriented to place and time.   Memory subjective described as intact.  Attention span & concentration ability appears normal.  Speech is fluent,  without  dysarthria, dysphonia or aphasia here .  Mood and affect are appropriate.   Cranial nerves: no loss of smell or taste reported  Pupils are equal and briskly reactive to light. Funduscopic exam deferred.  Extraocular movements in vertical and horizontal planes were intact and without nystagmus. No Diplopia. Visual fields by finger perimetry are intact. Hearing was intact to soft voice and finger rubbing.    Facial sensation intact to fine touch.  Facial motor strength is symmetric and tongue and uvula move midline.  Neck ROM : rotation, tilt and flexion extension were normal for age and shoulder shrug was symmetrical.    Motor exam:  Symmetric bulk, tone and  ROM.   Normal tone without cog wheeling, symmetric grip strength , not  full strength .   Sensory:  Fine touch and vibration were intact .  Proprioception tested in the upper extremities was normal.   Coordination: Rapid alternating movements in the fingers/hands were of normal speed.  The Finger-to-nose maneuver was intact without evidence of ataxia, dysmetria or tremor.   Gait and station: Patient could rise unassisted from a seated position, walked without assistive device.  Stance is of normal width/ base and the patient turned with 3 steps.  Toe and heel walk were deferred.  Deep tendon reflexes: in the  upper and lower extremities are symmetric and intact.  Babinski response was deferred.       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   2) EEG: spindle-form activity post HV maneuver on regular EEG/ Long ambulatory EEG is ordered. Dr April Manson to read.    I plan to follow up either personally or through our NP within 4 months .  CC: I will share my notes with PCP.   Electronically signed by: Veronica Seat, MD 12/21/2022 11:26 AM  [facility] Board certified by The [employer] of Sleep Medicine and Diplomate of the [employer] of Sleep Medicine. Board certified In Neurology through the [employer], Fellow of the [employer] of Neurology. Medical Director of [facility].

## 2022-12-21 NOTE — Patient Instructions (Signed)
Levetiracetam Tablets What is this medication? LEVETIRACETAM (lee ve tye RA se tam) prevents and controls seizures in people with epilepsy. It works by calming overactive nerves in your body. This medicine may be used for other purposes; ask your health care provider or pharmacist if you have questions. COMMON BRAND NAME(S): Keppra, Roweepra What should I tell my care team before I take this medication? They need to know if you have any of these conditions: Kidney disease Suicidal thoughts, plans, or attempt by you or a family member An unusual or allergic reaction to levetiracetam, other medications, foods, dyes, or preservatives Pregnant or trying to get pregnant Breast-feeding How should I use this medication? Take this medication by mouth with a glass of water. Follow the directions on the prescription label. Swallow the tablets whole. Do not crush or chew this medication. You may take this medication with or without food. Take your doses at regular intervals. Do not take your medication more often than directed. Do not stop taking this medication or any of your seizure medications unless instructed by your care team. Stopping your medication suddenly can increase your seizures or their severity. A special MedGuide will be given to you by the pharmacist with each prescription and refill. Be sure to read this information carefully each time. Contact your care team about the use of this medication in children. While this medication may be prescribed for children as young as 4 years of age for selected conditions, precautions do apply. Overdosage: If you think you have taken too much of this medicine contact a poison control center or emergency room at once. NOTE: This medicine is only for you. Do not share this medicine with others. What if I miss a dose? If you miss a dose, take it as soon as you can. If it is almost time for your next dose, take only that dose. Do not take double or extra  doses. What may interact with this medication? This medication may interact with the following: Carbamazepine Colesevelam Probenecid Sevelamer This list may not describe all possible interactions. Give your health care provider a list of all the medicines, herbs, non-prescription drugs, or dietary supplements you use. Also tell them if you smoke, drink alcohol, or use illegal drugs. Some items may interact with your medicine. What should I watch for while using this medication? Visit your care team for a regular check on your progress. Wear a medical identification bracelet or chain to say you have epilepsy, and carry a card that lists all your medications. This medication may cause serious skin reactions. They can happen weeks to months after starting the medication. Contact your care team right away if you notice fevers or flu-like symptoms with a rash. The rash may be red or purple and then turn into blisters or peeling of the skin. You may also notice a red rash with swelling of the face, lips, or lymph nodes in your neck or under your arms. It is important to take this medication exactly as instructed by your care team. When first starting treatment, your dose may need to be adjusted. It may take weeks or months before your dose is stable. You should contact your care team if your seizures get worse or if you have any new types of seizures. This medication may affect your coordination, reaction time, or judgment. Do not drive or operate machinery until you know how this medication affects you. Sit up or stand slowly to reduce the risk of dizzy or fainting   spells. Drinking alcohol with this medication can increase the risk of these side effects. This medication may cause thoughts of suicide or depression. This includes sudden changes in mood, behaviors, or thoughts. These changes can happen at any time but are more common in the beginning of treatment or after a change in dose. Call your care team  right away if you experience these thoughts or worsening depression. If you become pregnant while using this medication, you may enroll in the [place] American Antiepileptic Drug Pregnancy Registry by calling 1-888-233-2334. This registry collects information about the safety of antiepileptic medication use during pregnancy. What side effects may I notice from receiving this medication? Side effects that you should report to your care team as soon as possible: Allergic reactions or angioedema--skin rash, itching or hives, swelling of the face, eyes, lips, tongue, arms, or legs, trouble swallowing or breathing Increase in blood pressure in children Infection--fever, chills, cough, or sore throat Loss of balance or coordination Low red blood cell level--unusual weakness or fatigue, dizziness, headache, trouble breathing Mood and behavior changes--anxiety, nervousness, confusion, hallucinations, irritability, hostility, thoughts of suicide or self-harm, worsening mood, feelings of depression Rash, fever, and swollen lymph nodes Redness, swelling, and blistering of the skin over hands and feet Trouble walking Unusual bruising or bleeding Unusual weakness or fatigue Side effects that usually do not require medical attention (report these to your care team if they continue or are bothersome): Dizziness Drowsiness Fatigue Irritability Loss of appetite This list may not describe all possible side effects. Call your doctor for medical advice about side effects. You may report side effects to FDA at 1-800-FDA-1088. Where should I keep my medication? Keep out of reach of children. Store at room temperature between 15 and 30 degrees C (59 and 86 degrees F). Throw away any unused medication after the expiration date. NOTE: This sheet is a summary. It may not cover all possible information. If you have questions about this medicine, talk to your doctor, pharmacist, or health care provider.  2023  Elsevier/Gold Standard (2004-11-22 00:00:00)  

## 2022-12-22 ENCOUNTER — Encounter: Payer: Self-pay | Admitting: Neurology

## 2022-12-26 ENCOUNTER — Encounter: Payer: Self-pay | Admitting: Nurse Practitioner

## 2022-12-26 DIAGNOSIS — B009 Herpesviral infection, unspecified: Secondary | ICD-10-CM

## 2022-12-26 DIAGNOSIS — Z8619 Personal history of other infectious and parasitic diseases: Secondary | ICD-10-CM

## 2022-12-27 ENCOUNTER — Telehealth: Payer: Self-pay | Admitting: Nurse Practitioner

## 2022-12-27 DIAGNOSIS — F519 Sleep disorder not due to a substance or known physiological condition, unspecified: Secondary | ICD-10-CM

## 2022-12-27 DIAGNOSIS — H93233 Hyperacusis, bilateral: Secondary | ICD-10-CM

## 2022-12-27 DIAGNOSIS — R251 Tremor, unspecified: Secondary | ICD-10-CM

## 2022-12-27 DIAGNOSIS — R479 Unspecified speech disturbances: Secondary | ICD-10-CM

## 2022-12-27 DIAGNOSIS — Z91414 Personal history of adult intimate partner abuse: Secondary | ICD-10-CM

## 2022-12-27 DIAGNOSIS — R4789 Other speech disturbances: Secondary | ICD-10-CM

## 2022-12-27 DIAGNOSIS — N871 Moderate cervical dysplasia: Secondary | ICD-10-CM

## 2022-12-27 DIAGNOSIS — R4781 Slurred speech: Secondary | ICD-10-CM

## 2022-12-27 NOTE — Telephone Encounter (Signed)
Veronica Griffin called about a form for neuro diagnostics, they said it was faxed over 2 days ago and today. Did you happen to get the form? We dont see anything that may have been faxed today. She provided call back number [phone] and said you can ask for her if you do need to call.

## 2022-12-27 NOTE — Telephone Encounter (Signed)
Referral has been placed based on the faxed request to the provider and location requested.

## 2022-12-28 ENCOUNTER — Ambulatory Visit: Payer: 59

## 2022-12-28 DIAGNOSIS — Z9889 Other specified postprocedural states: Secondary | ICD-10-CM | POA: Diagnosis not present

## 2022-12-28 DIAGNOSIS — Z01419 Encounter for gynecological examination (general) (routine) without abnormal findings: Secondary | ICD-10-CM | POA: Diagnosis not present

## 2023-01-01 ENCOUNTER — Ambulatory Visit: Payer: 59 | Admitting: Neurology

## 2023-01-01 ENCOUNTER — Ambulatory Visit: Payer: 59

## 2023-01-02 ENCOUNTER — Other Ambulatory Visit: Payer: Self-pay

## 2023-01-02 ENCOUNTER — Ambulatory Visit: Payer: 59 | Attending: Neurology

## 2023-01-02 DIAGNOSIS — R479 Unspecified speech disturbances: Secondary | ICD-10-CM | POA: Diagnosis not present

## 2023-01-02 DIAGNOSIS — R482 Apraxia: Secondary | ICD-10-CM | POA: Diagnosis not present

## 2023-01-02 DIAGNOSIS — R4789 Other speech disturbances: Secondary | ICD-10-CM | POA: Diagnosis not present

## 2023-01-02 DIAGNOSIS — F82 Specific developmental disorder of motor function: Secondary | ICD-10-CM
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
## 2023-01-02 NOTE — Patient Instructions (Addendum)
  [facility]:  [phone]   Undiagnosed developmental apraxia of speech ?  Undiagnosed Specific Language Impairment ?  Of course these would only be in play if you had speech errors growing up.     Speech Exercises  Repeat these phrases 2 times a day  Call the cat "Buttercup" A calendar of [place], [place] Four floors to cover Yellow oil ointment Fellow lovers of felines Catastrophe in [place]' plums The church's chimes chimed Telling time 'til eleven Five valve levers Keep the gate closed Go see that guy Fat cows give milk [employer] Gophers Fat frogs flip freely Kohl's into bed Get that game to Greg Thick thistles stick together Cinnamon aluminum linoleum Black bugs blood Lovely lemon linament Red leather, yellow leather  Big grocery buggy    Purple baby carriage [facility] Proper copper coffee pot Ripe purple cabbage Three free throws [employer] tackled  [employer] dipped the dessert  [facility] that [employer] of [employer] Shirts shrink, shells shouldn't [place] 49ers Take the tackle box File the flash message Give me five flapjacks Fundamental relatives Dye the pets purple Talking [place] time after time Dark chocolate chunks Political landscape of the kingdom Estate manager/land agent genius We played yo-yos yesterday

## 2023-01-17 ENCOUNTER — Other Ambulatory Visit (HOSPITAL_COMMUNITY): Payer: Self-pay

## 2023-01-17 MED ORDER — VALACYCLOVIR HCL 1 G PO TABS
1000.0000 mg | ORAL_TABLET | Freq: Every day | ORAL | 3 refills | Status: AC
  Filled 2023-01-17: qty 90, 90d supply, fill #0

## 2023-01-18 ENCOUNTER — Other Ambulatory Visit (HOSPITAL_COMMUNITY): Payer: Self-pay

## 2023-01-18 ENCOUNTER — Ambulatory Visit: Payer: 59 | Admitting: Neurology

## 2023-01-18 ENCOUNTER — Other Ambulatory Visit: Payer: Self-pay

## 2023-01-18 ENCOUNTER — Other Ambulatory Visit: Payer: Self-pay | Admitting: Nurse Practitioner

## 2023-01-18 DIAGNOSIS — F902 Attention-deficit hyperactivity disorder, combined type: Secondary | ICD-10-CM

## 2023-01-18 NOTE — Addendum Note (Signed)
Addended by: Judi Cong on: 01/18/2023 09:13 AM   Modules accepted: Orders

## 2023-01-18 NOTE — Telephone Encounter (Signed)
Refill request last apt 11/28/22. 

## 2023-01-19 ENCOUNTER — Other Ambulatory Visit (HOSPITAL_COMMUNITY): Payer: Self-pay

## 2023-01-19 MED ORDER — AMPHETAMINE-DEXTROAMPHETAMINE 10 MG PO TABS
10.0000 mg | ORAL_TABLET | Freq: Two times a day (BID) | ORAL | 0 refills | Status: DC
  Filled 2023-01-19: qty 60, 30d supply, fill #0

## 2023-02-05 ENCOUNTER — Encounter: Payer: Self-pay | Admitting: Neurology

## 2023-02-07 ENCOUNTER — Other Ambulatory Visit: Payer: Self-pay | Admitting: Neurology

## 2023-02-08 ENCOUNTER — Other Ambulatory Visit (HOSPITAL_COMMUNITY): Payer: Self-pay

## 2023-02-08 MED ORDER — BRIVIACT 50 MG PO TABS
50.0000 mg | ORAL_TABLET | Freq: Two times a day (BID) | ORAL | 5 refills | Status: DC
  Filled 2023-02-08: qty 60, 30d supply, fill #0
  Filled 2023-03-19: qty 60, 30d supply, fill #1

## 2023-02-08 MED ORDER — BRIVIACT 50 MG PO TABS: 50.0000 mg | ORAL_TABLET | Freq: Two times a day (BID) | ORAL | 5 refills | Status: DC

## 2023-02-08 NOTE — Telephone Encounter (Signed)
Thank you! When you guys switch please send me their information thanks!!

## 2023-02-09 ENCOUNTER — Other Ambulatory Visit (HOSPITAL_COMMUNITY): Payer: Self-pay

## 2023-02-20 ENCOUNTER — Telehealth: Payer: Self-pay | Admitting: Neurology

## 2023-02-20 ENCOUNTER — Other Ambulatory Visit: Payer: Self-pay | Admitting: Nurse Practitioner

## 2023-02-20 DIAGNOSIS — F902 Attention-deficit hyperactivity disorder, combined type: Secondary | ICD-10-CM

## 2023-02-20 NOTE — Telephone Encounter (Signed)
The patient has been unable to schedule an EMU stay- and she needs a Summer follow up with me.  She seems to tolerate Breviact just well and is not in acute need of follow up.  We can d/c keppra once she takes 100 mg bid po.    CD

## 2023-02-20 NOTE — Telephone Encounter (Signed)
Refill request last apt 11/28/22. 

## 2023-02-21 NOTE — Telephone Encounter (Signed)
Please see previous mychart message for further instructions

## 2023-02-21 NOTE — Telephone Encounter (Signed)
Dr Dohmeier states this below.  "The patient has been unable to schedule an EMU stay- and she needs a Summer follow up with me.  She seems to tolerate Breviact just well and is not in acute need of follow up.  We can d/c keppra once she takes 100 mg bid po. "  I will clarify if Dr Vickey Huger is wanting to increase the Briviact to 100 mg BID since patient is currently on 50 mg BID and had not previously been instructed to increase.  I will confirm with the patient but per conversation in past, she had stopped keppra already and has been on 50 mg BID of Briviact.

## 2023-02-23 ENCOUNTER — Other Ambulatory Visit (HOSPITAL_COMMUNITY): Payer: Self-pay

## 2023-02-23 MED ORDER — AMPHETAMINE-DEXTROAMPHETAMINE 10 MG PO TABS
10.0000 mg | ORAL_TABLET | Freq: Two times a day (BID) | ORAL | 0 refills | Status: DC
  Filled 2023-02-23: qty 60, 30d supply, fill #0

## 2023-03-02 ENCOUNTER — Other Ambulatory Visit: Payer: 59

## 2023-03-02 DIAGNOSIS — R7303 Prediabetes: Secondary | ICD-10-CM | POA: Diagnosis not present

## 2023-03-02 LAB — HEMOGLOBIN A1C
Est. average glucose Bld gHb Est-mCnc: 114 mg/dL
Hgb A1c MFr Bld: 5.6 % (ref 4.8–5.6)

## 2023-03-19 ENCOUNTER — Other Ambulatory Visit: Payer: Self-pay | Admitting: Nurse Practitioner

## 2023-03-19 ENCOUNTER — Other Ambulatory Visit (HOSPITAL_COMMUNITY): Payer: Self-pay

## 2023-03-19 DIAGNOSIS — E559 Vitamin D deficiency, unspecified: Secondary | ICD-10-CM

## 2023-03-19 DIAGNOSIS — F902 Attention-deficit hyperactivity disorder, combined type: Secondary | ICD-10-CM

## 2023-03-19 NOTE — Telephone Encounter (Signed)
Refill request last apt 11/28/22. 

## 2023-03-20 ENCOUNTER — Other Ambulatory Visit (HOSPITAL_COMMUNITY): Payer: Self-pay

## 2023-03-21 ENCOUNTER — Ambulatory Visit: Payer: 59 | Admitting: Neurology

## 2023-03-21 ENCOUNTER — Other Ambulatory Visit (HOSPITAL_COMMUNITY): Payer: Self-pay

## 2023-03-24 MED ORDER — VITAMIN D (ERGOCALCIFEROL) 1.25 MG (50000 UNIT) PO CAPS
50000.0000 [IU] | ORAL_CAPSULE | ORAL | 0 refills | Status: DC
  Filled 2023-03-24: qty 12, 84d supply, fill #0

## 2023-03-24 MED ORDER — AMPHETAMINE-DEXTROAMPHETAMINE 10 MG PO TABS
10.0000 mg | ORAL_TABLET | Freq: Two times a day (BID) | ORAL | 0 refills | Status: DC
  Filled 2023-03-24 – 2023-05-14 (×2): qty 60, 30d supply, fill #0

## 2023-03-26 ENCOUNTER — Other Ambulatory Visit (HOSPITAL_COMMUNITY): Payer: Self-pay

## 2023-03-26 ENCOUNTER — Other Ambulatory Visit: Payer: Self-pay

## 2023-04-06 ENCOUNTER — Other Ambulatory Visit (HOSPITAL_COMMUNITY): Payer: Self-pay

## 2023-04-09 ENCOUNTER — Other Ambulatory Visit: Payer: Self-pay

## 2023-04-09 ENCOUNTER — Inpatient Hospital Stay (HOSPITAL_COMMUNITY)
Admission: RE | Admit: 2023-04-09 | Discharge: 2023-04-12 | DRG: 093 | Disposition: A | Discharge status: Home / Self Care | Payer: 59 | Source: Ambulatory Visit | Attending: Neurology | Admitting: Neurology

## 2023-04-09 ENCOUNTER — Inpatient Hospital Stay (HOSPITAL_COMMUNITY): Payer: 59

## 2023-04-09 ENCOUNTER — Encounter (HOSPITAL_COMMUNITY): Payer: Self-pay | Admitting: Neurology

## 2023-04-09 DIAGNOSIS — Z87891 Personal history of nicotine dependence: Secondary | ICD-10-CM | POA: Diagnosis not present

## 2023-04-09 DIAGNOSIS — Z79899 Other long term (current) drug therapy: Secondary | ICD-10-CM

## 2023-04-09 DIAGNOSIS — F909 Attention-deficit hyperactivity disorder, unspecified type: Secondary | ICD-10-CM | POA: Diagnosis present

## 2023-04-09 DIAGNOSIS — I10 Essential (primary) hypertension: Secondary | ICD-10-CM | POA: Diagnosis not present

## 2023-04-09 DIAGNOSIS — Z8249 Family history of ischemic heart disease and other diseases of the circulatory system: Secondary | ICD-10-CM

## 2023-04-09 DIAGNOSIS — Z87410 Personal history of cervical dysplasia: Secondary | ICD-10-CM | POA: Diagnosis not present

## 2023-04-09 DIAGNOSIS — R569 Unspecified convulsions: Secondary | ICD-10-CM

## 2023-04-09 DIAGNOSIS — R4789 Other speech disturbances: Secondary | ICD-10-CM | POA: Diagnosis not present

## 2023-04-09 DIAGNOSIS — R479 Unspecified speech disturbances: Secondary | ICD-10-CM | POA: Diagnosis not present

## 2023-04-09 LAB — CBC WITH DIFFERENTIAL/PLATELET
Abs Immature Granulocytes: 0.02 10*3/uL (ref 0.00–0.07)
Basophils Absolute: 0 10*3/uL (ref 0.0–0.1)
Basophils Relative: 0 %
Eosinophils Absolute: 0 10*3/uL (ref 0.0–0.5)
Eosinophils Relative: 1 %
HCT: 37.2 % (ref 36.0–46.0)
Hemoglobin: 12.1 g/dL (ref 12.0–15.0)
Immature Granulocytes: 0 %
Lymphocytes Relative: 29 %
Lymphs Abs: 1.5 10*3/uL (ref 0.7–4.0)
MCH: 25.1 pg — ABNORMAL LOW (ref 26.0–34.0)
MCHC: 32.5 g/dL (ref 30.0–36.0)
MCV: 77 fL — ABNORMAL LOW (ref 80.0–100.0)
Monocytes Absolute: 0.2 10*3/uL (ref 0.1–1.0)
Monocytes Relative: 4 %
Neutro Abs: 3.5 10*3/uL (ref 1.7–7.7)
Neutrophils Relative %: 66 %
Platelets: 307 10*3/uL (ref 150–400)
RBC: 4.83 MIL/uL (ref 3.87–5.11)
RDW: 14.3 % (ref 11.5–15.5)
WBC: 5.3 10*3/uL (ref 4.0–10.5)
nRBC: 0 % (ref 0.0–0.2)

## 2023-04-09 LAB — COMPREHENSIVE METABOLIC PANEL
ALT: 24 U/L (ref 0–44)
AST: 19 U/L (ref 15–41)
Albumin: 3.9 g/dL (ref 3.5–5.0)
Alkaline Phosphatase: 29 U/L — ABNORMAL LOW (ref 38–126)
Anion gap: 8 (ref 5–15)
BUN: 11 mg/dL (ref 6–20)
CO2: 24 mmol/L (ref 22–32)
Calcium: 8.8 mg/dL — ABNORMAL LOW (ref 8.9–10.3)
Chloride: 106 mmol/L (ref 98–111)
Creatinine, Ser: 0.79 mg/dL (ref 0.44–1.00)
GFR, Estimated: >60 mL/min (ref >60)
Glucose, Bld: 141 mg/dL — ABNORMAL HIGH (ref 70–99)
Potassium: 3.7 mmol/L (ref 3.5–5.1)
Sodium: 138 mmol/L (ref 135–145)
Total Bilirubin: 0.8 mg/dL (ref 0.3–1.2)
Total Protein: 6.9 g/dL (ref 6.5–8.1)

## 2023-04-09 LAB — PHOSPHORUS: Phosphorus: 2.8 mg/dL (ref 2.5–4.6)

## 2023-04-09 LAB — RAPID URINE DRUG SCREEN, HOSP PERFORMED
Amphetamines: NOT DETECTED
Barbiturates: NOT DETECTED
Benzodiazepines: NOT DETECTED
Cocaine: NOT DETECTED
Opiates: NOT DETECTED
Tetrahydrocannabinol: NOT DETECTED

## 2023-04-09 LAB — GLUCOSE, CAPILLARY: Glucose-Capillary: 97 mg/dL (ref 70–99)

## 2023-04-09 LAB — MAGNESIUM: Magnesium: 1.9 mg/dL (ref 1.7–2.4)

## 2023-04-09 LAB — PROTIME-INR
INR: 1 (ref 0.8–1.2)
Prothrombin Time: 13.8 seconds (ref 11.4–15.2)

## 2023-04-09 LAB — HIV ANTIBODY (ROUTINE TESTING W REFLEX): HIV Screen 4th Generation wRfx: NONREACTIVE

## 2023-04-09 MED ORDER — ACETAMINOPHEN 650 MG RE SUPP: 650.0000 mg | RECTAL | Status: DC | PRN

## 2023-04-09 MED ORDER — MIDAZOLAM HCL 2 MG/2ML IJ SOLN: 2.0000 mg | INTRAMUSCULAR | Status: DC | PRN

## 2023-04-09 MED ORDER — ENOXAPARIN SODIUM 40 MG/0.4ML IJ SOSY
40.0000 mg | PREFILLED_SYRINGE | INTRAMUSCULAR | Status: DC
  Administered 2023-04-09 – 2023-04-11 (×3): 40 mg via SUBCUTANEOUS
  Filled 2023-04-09 (×4): qty 0.4

## 2023-04-09 MED ORDER — LABETALOL HCL 5 MG/ML IV SOLN: 5.0000 mg | INTRAVENOUS | Status: DC | PRN

## 2023-04-09 MED ORDER — ACETAMINOPHEN 325 MG PO TABS: 650.0000 mg | ORAL_TABLET | ORAL | Status: DC | PRN

## 2023-04-09 MED ORDER — LABETALOL HCL 200 MG PO TABS
200.0000 mg | ORAL_TABLET | Freq: Two times a day (BID) | ORAL | Status: DC
  Administered 2023-04-09 – 2023-04-12 (×6): 200 mg via ORAL
  Filled 2023-04-09 (×6): qty 1

## 2023-04-09 NOTE — Progress Notes (Signed)
vLTM setup  All impedances below 10kohms.  [facility] monitoring.  EMU admit.  Patient event button tested.  Patient instructed on use of event button
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
## 2023-04-09 NOTE — TOC CM/SW Note (Signed)
Transition of Care [facility]) - Inpatient Brief Assessment   Patient Details  Name: Ayleah Ruscio MRN: [id number] Date of Birth: 30-Jun-1991  Transition of Care [facility]) CM/SW Contact:    Kermit Balo, RN Phone Number: 04/09/2023, 3:26 PM   Clinical Narrative: Pt admitted to EMU.   Transition of Care Asessment: Insurance and Status: Insurance coverage has been reviewed Patient has primary care physician: Yes Home environment has been reviewed: home with spouse   Prior/Current Home Services: No current home services Social Determinants of Health Reivew: SDOH reviewed no interventions necessary Readmission risk has been reviewed: Yes Transition of care needs: no transition of care needs at this time

## 2023-04-10 NOTE — Progress Notes (Signed)
vLTM maintenance  All impedances below 10kohms.  No skin breakdown noted at  FP1  F3  T3  T5

## 2023-04-10 NOTE — Procedures (Signed)
Patient Name: Veronica Griffin  MRN: [id number]  Epilepsy Attending: Charlsie Quest  Referring Physician/Provider: Charlsie Quest, MD  Duration: 04/09/2023 1135 to 04/10/2023 1135  Patient history: 32 year old female admitted to epilepsy monitoring unit for characterization of spells. EEG to evaluate for seizure.  Level of alertness: Awake,asleep  AEDs during EEG study: None  Technical aspects: This EEG study was done with scalp electrodes positioned according to the 10-20 International system of electrode placement. Electrical activity was reviewed with band pass filter of 1-70Hz , sensitivity of 7 uV/mm, display speed of 84mm/sec with a 60Hz  notched filter applied as appropriate. EEG data were recorded continuously and digitally stored.  Video monitoring was available and reviewed as appropriate.  Description: The posterior dominant rhythm consists of 9 Hz activity of moderate voltage (25-35 uV) seen predominantly in posterior head regions, symmetric and reactive to eye opening and eye closing. Sleep was characterized by vertex waves, sleep spindles (12 to 14 Hz), maximal frontocentral region.  Physiologic photic driving was seen during photic stimulation.  No EEG change was seen during hyperventilation.  Event button was pressed on 04/09/2023 at 2047 for stuttering speech. Concomitant EEG before, during and after the event did not show any EEG changes to suggest seizure.  IMPRESSION: This study is within normal limits. No seizures or epileptiform discharges were seen throughout the recording.  A normal interictal EEG does not exclude the diagnosis of epilepsy.  Veronica Griffin Veronica Griffin

## 2023-04-10 NOTE — Progress Notes (Signed)
Activations completed. No issues to report

## 2023-04-10 NOTE — Progress Notes (Signed)
Subjective: Had 1 episode overnight described as stuttering speech.  Per husband it was pretty mild compared to her other episodes.  ROS: negative except above  Examination  Vital signs in last 24 hours: Temp:  [97.7 F (36.5 C)-98.7 F (37.1 C)] 98 F (36.7 C) (07/09 1154) Pulse Rate:  [56-77] 72 (07/09 1154) Resp:  [16-19] 18 (07/09 1154) BP: (110-136)/(73-90) 124/86 (07/09 1154) SpO2:  [99 %-100 %] 99 % (07/09 1154) Weight:  [73 kg] 73 kg (07/08 1500)  General: lying in bed, NAD Neuro: MS: Alert, oriented, follows commands CN: pupils equal and reactive,  EOMI, face symmetric, tongue midline, normal sensation over face, Motor: 5/5 strength in all 4 extremities Coordination: normal Gait: not tested  Basic Metabolic Panel: Recent Labs  Lab 04/09/23 1120  NA 138  K 3.7  CL 106  CO2 24  GLUCOSE 141*  BUN 11  CREATININE 0.79  CALCIUM 8.8*  MG 1.9  PHOS 2.8    CBC: Recent Labs  Lab 04/09/23 1120  WBC 5.3  NEUTROABS 3.5  HGB 12.1  HCT 37.2  MCV 77.0*  PLT 307     Coagulation Studies: Recent Labs    04/09/23 1120  LABPROT 13.8  INR 1.0    Imaging No new brain imaging overnight   ASSESSMENT AND PLAN: 32 year old female admitted to epilepsy monitoring unit for characterization of spells.   Transient speech arrest -Continue video EEG monitoring for characterization of spells -Continue to hold Briviact for seizure provocation -Will perform hyperventilation, photic stimulation and sleep deprivation for seizure provocation today -Discussed overnight EEG findings -Continue seizure precautions -As needed IV Versed for clinical seizures   Hypertension - continue home labetalol   ADHD -Reports taking Adderall if needed when she goes to work.  Will hold off while in the hospital  I have spent a total of   36 minutes with the patient reviewing hospital notes,  test results, labs and examining the patient as well as establishing an assessment and plan  that was discussed personally with the patient.  > 50% of time was spent in direct patient care.    '    Veronica Griffin Epilepsy [facility] For questions after 5pm please refer to [facility] to reach the Neurologist on call

## 2023-04-11 NOTE — Procedures (Addendum)
Patient Name: Veronica Griffin  MRN: [id number]  Epilepsy Attending: Charlsie Quest  Referring Physician/Provider: Charlsie Quest, MD  Duration: 04/10/2023 1135 to 04/11/2023 1135   Patient history: 32 year old female admitted to epilepsy monitoring unit for characterization of spells. EEG to evaluate for seizure.   Level of alertness: Awake,asleep   AEDs during EEG study: None   Technical aspects: This EEG study was done with scalp electrodes positioned according to the 10-20 International system of electrode placement. Electrical activity was reviewed with band pass filter of 1-70Hz , sensitivity of 7 uV/mm, display speed of 27mm/sec with a 60Hz  notched filter applied as appropriate. EEG data were recorded continuously and digitally stored.  Video monitoring was available and reviewed as appropriate.   Description: The posterior dominant rhythm consists of 9 Hz activity of moderate voltage (25-35 uV) seen predominantly in posterior head regions, symmetric and reactive to eye opening and eye closing. Sleep was characterized by vertex waves, sleep spindles (12 to 14 Hz), maximal frontocentral region.    IMPRESSION: This study is within normal limits. No seizures or epileptiform discharges were seen throughout the recording.   A normal interictal EEG does not exclude the diagnosis of epilepsy.   [name]

## 2023-04-11 NOTE — Progress Notes (Signed)
Fixed leads.  All under 10

## 2023-04-11 NOTE — Progress Notes (Signed)
Subjective: Veronica Griffin. No seizures  ROS: negative except above Examination  Vital signs in last 24 hours: Temp:  [98 F (36.7 C)-98.3 F (36.8 C)] 98.3 F (36.8 C) (07/10 1255) Pulse Rate:  [64-79] 79 (07/10 1255) Resp:  [16-19] 19 (07/10 1255) BP: (98-134)/(64-103) 102/71 (07/10 1255) SpO2:  [98 %-100 %] 99 % (07/10 1255)  General: lying in bed, NAD Neuro: MS: Alert, oriented, follows commands CN: pupils equal and reactive,  EOMI, face symmetric, tongue midline, normal sensation over face, Motor: 5/5 strength in all 4 extremities Coordination: normal Gait: not tested  Basic Metabolic Panel: Recent Labs  Lab 04/09/23 1120  NA 138  K 3.7  CL 106  CO2 24  GLUCOSE 141*  BUN 11  CREATININE 0.79  CALCIUM 8.8*  MG 1.9  PHOS 2.8    CBC: Recent Labs  Lab 04/09/23 1120  WBC 5.3  NEUTROABS 3.5  HGB 12.1  HCT 37.2  MCV 77.0*  PLT 307     Coagulation Studies: Recent Labs    04/09/23 1120  LABPROT 13.8  INR 1.0    Imaging No new brain imaging overnight     ASSESSMENT AND PLAN: 32 year old female admitted to epilepsy monitoring unit for characterization of spells.   Transient speech arrest -Continue video EEG monitoring for characterization of spells -Continue to hold Briviact for seizure provocation -Continue sleep deprivation for seizure provocation today -Discussed overnight EEG findings -Continue seizure precautions -As needed IV Versed for clinical seizures   Hypertension - continue home labetalol   ADHD -Reports taking Adderall if needed when she goes to work.  Will hold off while in the hospital   I have spent a total of   26 minutes with the patient reviewing hospital notes,  test results, labs and examining the patient as well as establishing an assessment and plan that was discussed personally with the patient.  > 50% of time was spent in direct patient care.      Lindie Spruce Epilepsy [facility] For questions after 5pm  please refer to AMION to reach the Neurologist on call

## 2023-04-11 NOTE — Progress Notes (Signed)
Pt stayed up to 3am before falling asleep, no seizure like activity observed, pt reassured, will continue to monitor. Obasogie-Asidi, Kareem Aul Efe

## 2023-04-12 ENCOUNTER — Other Ambulatory Visit (HOSPITAL_COMMUNITY): Payer: Self-pay

## 2023-04-12 DIAGNOSIS — R479 Unspecified speech disturbances: Secondary | ICD-10-CM

## 2023-04-12 MED ORDER — CLONAZEPAM 1 MG PO TABS
2.0000 mg | ORAL_TABLET | ORAL | 0 refills | Status: DC | PRN
  Filled 2023-04-12: qty 5, 3d supply, fill #0

## 2023-04-12 NOTE — Procedures (Signed)
Patient Name: Veronica Griffin  MRN: [id number]  Epilepsy Attending: Charlsie Quest  Referring Physician/Provider: Charlsie Quest, MD  Duration: 04/11/2023 1135 to 04/12/2023 1258   Patient history: 32 year old female admitted to epilepsy monitoring unit for characterization of spells. EEG to evaluate for seizure.   Level of alertness: Awake, asleep   AEDs during EEG study: None   Technical aspects: This EEG study was done with scalp electrodes positioned according to the 10-20 International system of electrode placement. Electrical activity was reviewed with band pass filter of 1-70Hz , sensitivity of 7 uV/mm, display speed of 66mm/sec with a 60Hz  notched filter applied as appropriate. EEG data were recorded continuously and digitally stored.  Video monitoring was available and reviewed as appropriate.   Description: The posterior dominant rhythm consists of 9 Hz activity of moderate voltage (25-35 uV) seen predominantly in posterior head regions, symmetric and reactive to eye opening and eye closing. Sleep was characterized by vertex waves, sleep spindles (12 to 14 Hz), maximal frontocentral region.    IMPRESSION: This study is within normal limits. No seizures or epileptiform discharges were seen throughout the recording.   A normal interictal EEG does not exclude the diagnosis of epilepsy.   Veronica Griffin

## 2023-04-12 NOTE — TOC Transition Note (Signed)
Transition of Care [facility]) - CM/SW Discharge Note   Patient Details  Name: Veronica Griffin MRN: [id number] Date of Birth: [birth year]  [facility] CM/SW Contact:  Kermit Balo, RN Phone Number: 04/12/2023, 1:11 PM   Clinical Narrative:     Pt is discharging home with self care. No needs per TOC.  Final next level of care: Home/Self Care Barriers to Discharge: No Barriers Identified   Patient Goals and CMS Choice      Discharge Placement                         Discharge Plan and Services Additional resources added to the After Visit Summary for                                       Social Determinants of Health (SDOH) Interventions SDOH Screenings   Food Insecurity: No Food Insecurity (04/09/2023)  Housing: Low Risk  (04/09/2023)  Transportation Needs: No Transportation Needs (04/09/2023)  Utilities: Not At Risk (04/09/2023)  Depression (PHQ2-9): Low Risk  (11/28/2022)  Tobacco Use: Medium Risk (04/09/2023)     Readmission Risk Interventions     No data to display

## 2023-04-12 NOTE — Discharge Summary (Signed)
Physician Discharge Summary  Patient ID: Veronica Griffin MRN: [id number] DOB/AGE: 32-Jan-1992 32 y.o.  Admit date: 04/09/2023 Discharge date: 04/12/2023  Admission Diagnoses: Seizure  Discharge Diagnoses: Transient speech abnormality  Discharged Condition: stable  Hospital Course: Patient was admitted to epilepsy monitoring unit between 04/09/23 to 04/12/2023.  During this time, she underwent continuous video EEG monitoring.  Briviact was held.  Hyperventilation, photic stimulation and sleep deprivation were performed.  1 episode was recorded as described in the MRI report without concomitant EEG.  No ictal-interictal abnormality was recorded.  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  Given the semiology of symptoms and location of this lesion, there is suspicion for seizures.  We discussed about increasing levetiracetam.  However patient wanted to see if we can discontinue medication.  Patient has not had a generalized tonic-clonic seizure, reports being aware during all these episodes.  Therefore if these are epileptic and she does not take medications, these episodes could potentially get more frequent and severe intensity.  At that point we could consider another EMU admission for characterization.  However in the meantime, patient understands the risk of stopping medication.  I discussed maintaining a seizure diary, seizure precautions including not driving.  I also prescribed rescue medication clonazepam 2 mg for seizure lasting more than 2 minutes.  Consults: None  Significant Diagnostic Studies:   EEG: The posterior dominant rhythm consists of 9 Hz activity of moderate voltage (25-35 uV) seen predominantly in posterior head regions, symmetric and reactive to eye opening and eye closing. Sleep was characterized by vertex waves, sleep spindles (12 to 14 Hz), maximal frontocentral region.   Physiologic photic driving was seen during photic stimulation.  No EEG change was seen during hyperventilation.   Event button was pressed on 04/09/2023 at 2047 for stuttering speech. Concomitant EEG before, during and after the event did not show any EEG changes to suggest seizure.   IMPRESSION: This study is within normal limits. No seizures or epileptiform discharges were seen throughout the recording.   A normal interictal EEG does not exclude the diagnosis of epilepsy.    Treatments: As needed clonazepam 2 mg for seizure lasting more than 2 minutes  Discharge Exam: Blood pressure 123/83, pulse 65, temperature 98.7 F (37.1 C), temperature source Oral, resp. rate 18, height 5\' 6"  (1.676 m), weight 73 kg, SpO2 99%.  General: lying in bed, NAD Neuro: MS: Alert, oriented, follows commands CN: pupils equal and reactive,  EOMI, face symmetric, tongue midline, normal sensation over face, Motor: 5/5 strength in all 4 extremities Coordination: normal Gait: not tested  Discharge disposition: 01-Home or Self Care   Discharge Instructions     Call MD for:   Complete by: As directed    If patient has another seizure, call 911 and bring them back to the ED if: A.  The seizure lasts longer than 5 minutes.      B.  The patient doesn't wake shortly after the seizure or has new problems such as difficulty seeing, speaking or moving following the seizure C.  The patient was injured during the seizure D.  The patient has a temperature over 102 F (39C) E.  The patient vomited during the seizure and now is having trouble breathing      Diet - low sodium heart healthy   Complete by: As directed    Discharge instructions   Complete by: As directed  Seizure precautions: Per [facility] statutes, patients with seizures are not allowed to drive until they have been seizure-free for six months and cleared by a physician    Use caution when using heavy equipment or power tools. Avoid  working on ladders or at heights. Take showers instead of baths. Ensure the water temperature is not too high on the home water heater. Do not go swimming alone. Do not lock yourself in a room alone (i.e. bathroom). When caring for infants or small children, sit down when holding, feeding, or changing them to minimize risk of injury to the child in the event you have a seizure. Maintain good sleep hygiene. Avoid alcohol.      Increase activity slowly   Complete by: As directed       Allergies as of 04/12/2023   No Known Allergies      Medication List     STOP taking these medications    Briviact 50 MG Tabs Generic drug: Brivaracetam   levETIRAcetam 500 MG tablet Commonly known as: KEPPRA       TAKE these medications    amphetamine-dextroamphetamine 10 MG tablet Commonly known as: Adderall Take 1 tablet (10 mg total) by mouth 2 (two) times daily. What changed:  when to take this reasons to take this   clonazePAM 1 MG tablet Commonly known as: KlonoPIN Take 2 tablets (2 mg total) by mouth as needed for anxiety (seizure).   labetalol 200 MG tablet Commonly known as: NORMODYNE Take 1 tablet (200 mg total) by mouth 2 (two) times daily. Note change in dose of tablet.   valACYclovir 1000 MG tablet Commonly known as: VALTREX Take 1 tablet (1,000 mg total) by mouth daily. What changed:  when to take this reasons to take this   Vitamin D (Ergocalciferol) 1.25 MG (50000 UNIT) Caps capsule Commonly known as: DRISDOL Take 1 capsule (50,000 Units total) by mouth every 7 (seven) days. Take for 12 total doses(weeks)       Seizure precautions: Per [facility] statutes, patients with seizures are not allowed to drive until they have been seizure-free for six months and cleared by a physician    Use caution when using heavy equipment or power tools. Avoid working on ladders or at heights. Take showers instead of baths. Ensure the water temperature is not too high on the  home water heater. Do not go swimming alone. Do not lock yourself in a room alone (i.e. bathroom). When caring for infants or small children, sit down when holding, feeding, or changing them to minimize risk of injury to the child in the event you have a seizure. Maintain good sleep hygiene. Avoid alcohol.    If patient has another seizure, call 911 and bring them back to the ED if: A.  The seizure lasts longer than 5 minutes.      B.  The patient doesn't wake shortly after the seizure or has new problems such as difficulty seeing, speaking or moving following the seizure C.  The patient was injured during the seizure D.  The patient has a temperature over 102 F (39C) E.  The patient vomited during the seizure and now is having trouble breathing    During the Seizure   - First, ensure adequate ventilation and place patients on the floor on their left side  Loosen clothing around the neck and ensure the airway is patent. If the patient is clenching the teeth, do not force the mouth open with any  object as this can cause severe damage - Remove all items from the surrounding that can be hazardous. The patient may be oblivious to what's happening and may not even know what he or she is doing. If the patient is confused and wandering, either gently guide him/her away and block access to outside areas - Reassure the individual and be comforting - Call 911. In most cases, the seizure ends before EMS arrives. However, there are cases when seizures may last over 3 to 5 minutes. Or the individual may have developed breathing difficulties or severe injuries. If a pregnant patient or a person with diabetes develops a seizure, it is prudent to call an ambulance. - Finally, if the patient does not regain full consciousness, then call EMS. Most patients will remain confused for about 45 to 90 minutes after a seizure, so you must use judgment in calling for help.     After the Seizure (Postictal Stage)   After  a seizure, most patients experience confusion, fatigue, muscle pain and/or a headache. Thus, one should permit the individual to sleep. For the next few days, reassurance is essential. Being calm and helping reorient the person is also of importance.   Most seizures are painless and end spontaneously. Seizures are not harmful to others but can lead to complications such as stress on the lungs, brain and the heart. Individuals with prior lung problems may develop labored breathing and respiratory distress.   I have spent a total of 40   minutes with the patient reviewing hospital notes,  test results, labs and examining the patient as well as establishing an assessment and plan that was discussed personally with the patient.  > 50% of time was spent in direct patient care.         Signed: Charlsie Quest 04/12/2023, 1:08 PM

## 2023-04-12 NOTE — Discharge Instructions (Addendum)
You were admitted to epilepsy monitoring unit between 04/09/2023 to 04/12/2023.  During this time you underwent continuous video EEG monitoring. Briviact was held.  Hyperventilation, photic stimulation and sleep deprivation were performed.  Your EEG was within normal limits.  One episode was recorded without concomitant EEG change.  However, focal seizures without alteration of awareness may not be seen on scalp EEG.  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  This MRI finding may not definitively suggest epilepsy but does raise suspicion because of its location and your symptoms (speech abnormality).  We discussed about increasing Briviact.  However, another option was to completely stop antiseizure medications and monitor to see if these episodes become more frequent at which point another EMU admission might be more informative.  Because you wanted to stop the medication, we are prescribing clonazepam 2 mg as a rescue medication for seizure lasting more than 2 minutes.  Also discussed driving restrictions

## 2023-04-12 NOTE — Progress Notes (Signed)
Discharge instructions given. Patient verbalized understanding and all questions were answered.  ?

## 2023-04-12 NOTE — Progress Notes (Signed)
LTM EEG discontinued - no skin breakdown at unhook.   

## 2023-04-12 NOTE — Plan of Care (Signed)

## 2023-04-18 ENCOUNTER — Telehealth: Payer: Self-pay | Admitting: Neurology

## 2023-04-18 NOTE — Telephone Encounter (Signed)
Charlsie Quest, MD  Lacheryl Niesen, Porfirio Mylar, MD EEG normal. Had one brief episode with no eeg change. However, I am worried because of the lesion in left frontal and insular region so wanted to leaver her on briviat but she wanted to stop.  I counselled that if episodes get worse, cal [place] back and maybe we can do another emu and get to see more episodes. Also gave her some clonazepam for rescue.     I want to thank Dr Melynda Ripple for the excellent work up and care. I will follow up with Ms. Henault in the next 4-5 weeks. This can be a virtual visit.   Melvyn Novas, MD

## 2023-05-02 ENCOUNTER — Encounter: Payer: Self-pay | Admitting: Nurse Practitioner

## 2023-05-08 ENCOUNTER — Ambulatory Visit: Payer: 59 | Admitting: Neurology

## 2023-05-14 ENCOUNTER — Other Ambulatory Visit (HOSPITAL_BASED_OUTPATIENT_CLINIC_OR_DEPARTMENT_OTHER): Payer: Self-pay | Admitting: Nurse Practitioner

## 2023-05-14 ENCOUNTER — Other Ambulatory Visit (HOSPITAL_COMMUNITY): Payer: Self-pay

## 2023-05-14 ENCOUNTER — Encounter: Payer: Self-pay | Admitting: Nurse Practitioner

## 2023-05-14 ENCOUNTER — Other Ambulatory Visit: Payer: Self-pay

## 2023-05-14 DIAGNOSIS — I10 Essential (primary) hypertension: Secondary | ICD-10-CM

## 2023-05-14 MED ORDER — LABETALOL HCL 200 MG PO TABS
200.0000 mg | ORAL_TABLET | Freq: Two times a day (BID) | ORAL | 1 refills | Status: DC
Start: 2023-05-14 — End: 2023-11-30
  Filled 2023-05-14: qty 180, 90d supply, fill #0

## 2023-06-01 ENCOUNTER — Encounter: Payer: Self-pay | Admitting: Nurse Practitioner

## 2023-06-15 ENCOUNTER — Other Ambulatory Visit: Payer: Self-pay | Admitting: Nurse Practitioner

## 2023-06-15 DIAGNOSIS — F902 Attention-deficit hyperactivity disorder, combined type: Secondary | ICD-10-CM

## 2023-06-18 ENCOUNTER — Other Ambulatory Visit (HOSPITAL_COMMUNITY): Payer: Self-pay

## 2023-06-18 MED ORDER — AMPHETAMINE-DEXTROAMPHETAMINE 10 MG PO TABS
10.0000 mg | ORAL_TABLET | Freq: Two times a day (BID) | ORAL | 0 refills | Status: DC
Start: 2023-06-18 — End: 2023-06-19
  Filled 2023-06-18: qty 60, 30d supply, fill #0

## 2023-06-18 NOTE — Telephone Encounter (Signed)
Last apt 11/28/22

## 2023-06-19 ENCOUNTER — Ambulatory Visit (INDEPENDENT_AMBULATORY_CARE_PROVIDER_SITE_OTHER): Payer: 59 | Admitting: Neurology

## 2023-06-19 ENCOUNTER — Encounter: Payer: Self-pay | Admitting: Neurology

## 2023-06-19 ENCOUNTER — Other Ambulatory Visit (HOSPITAL_COMMUNITY): Payer: Self-pay

## 2023-06-19 VITALS — BP 128/94 | HR 91 | Ht 66.0 in | Wt 165.0 lb

## 2023-06-19 DIAGNOSIS — Q283 Other malformations of cerebral vessels: Secondary | ICD-10-CM | POA: Insufficient documentation

## 2023-06-19 DIAGNOSIS — R4789 Other speech disturbances: Secondary | ICD-10-CM | POA: Diagnosis not present

## 2023-06-19 DIAGNOSIS — F988 Other specified behavioral and emotional disorders with onset usually occurring in childhood and adolescence: Secondary | ICD-10-CM | POA: Diagnosis not present

## 2023-06-19 MED ORDER — AMPHETAMINE-DEXTROAMPHET ER 15 MG PO CP24
15.0000 mg | ORAL_CAPSULE | ORAL | 0 refills | Status: DC
  Filled 2023-06-19: qty 30, 30d supply, fill #0

## 2023-06-19 NOTE — Progress Notes (Signed)
Provider:  Melvyn Novas, MD  Primary Care Physician:  Tollie Eth, NP [address] [place] [address]     Referring Provider: Tollie Eth, Np [address] [place],  [place] [address]          Chief Complaint according to patient   Patient presents with:     Follow up visit            HISTORY OF PRESENT ILLNESS:  Veronica Griffin is a 32 y.o. female patient who is here for revisit 06/19/2023 . patient had an EMU visit  7-8-through 7-11 in hospital EMU.   Chief concern according to patient :  like to know the results.  Lillyth Kuethe is a 32 y.o. female with past medical history of hypertension, ADHD was admitted to epilepsy monitoring unit for characterization of transient neurological symptoms.   Hospital Course: Patient was admitted to epilepsy monitoring unit between 04/09/23 to 04/12/2023.  During this time, she underwent continuous video EEG monitoring.   Briviact was held.    Hyperventilation, photic stimulation and sleep deprivation were performed.  1 episode was recorded as described in the MRI report without concomitant EEG.  No ictal-interictal abnormality was recorded.   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  Given the semiology of symptoms and location of this lesion, there is suspicion for seizures.    Dr Melynda Ripple:  We discussed about increasing levetiracetam.  However patient wanted to see if we can discontinue medication.   Patient has not had a generalized tonic-clonic seizure, reports being aware during all these episodes.  Therefore if these are epileptic and she does not take medications, these episodes could potentially get more frequent and severe intensity.  At that point we could consider another EMU admission for characterization.  However in the meantime, patient understands the risk of stopping medication.   Dr  Melynda Ripple : I discussed maintaining a seizure diary, seizure precautions including not driving.  I also prescribed rescue medication clonazepam 2 mg for seizure lasting more than 2 minutes.   Description: Patient states she has been having episodes where in the middle of talking, she cannot get her words out or sees words that are incorrect.  States these episodes started about 8 to 9 months ago but have been getting more frequent in the last 5 to 6 months, happening few times a week sometimes.  States they can last for few minutes but she does not feel like herself or 30 minutes to few hours after the episode.  Denies any clear warning signs.   She was started on Keppra but unable to tolerate and was recently switched to Briviact.   States episodes have continued even though she has been taking Briviact.  Denies any muscle jerking, weakness, numbness.  Does report worsening/blurry vision in both eyes but thinks that may be because she has not had her eyes checked and she wears glasses.    The patient has d/c BREVIACT . She reported slurred speech attacks even while on Breviact. Will allow her to d/c the medication. She can continue to use adderall.  Will continue on Adderall.     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    Social Determinants of Health   Financial Resource Strain: Not on file  Food Insecurity: No Food Insecurity (04/09/2023)   Hunger Vital Sign    Worried About Running Out of Food in the Last Year: Never true    Ran Out of Food in the Last Year: Never true  Transportation Needs: No Transportation Needs (04/09/2023)   PRAPARE - Administrator, Civil Service (Medical): No    Lack of Transportation (Non-Medical): No  Physical Activity: Not on file  Stress: Not on file  Social Connections: Not on file    Family History  Problem Relation Age of Onset   Hypertension Mother    Hypertension Father    Hypertension Maternal Aunt     Past Medical History:  Diagnosis Date   Antepartum hemorrhage 10/08/2012   Had [place] in MAU at 8 weeks--[facility]. Repeat US at 10 weeks at [facility]--viable IUP, [facility] noted.   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 10/10/2012   On Aldomet 250 mg po TID from [facility] [place] q 4 weeks for growth NST twice weekly or BPP q week to start at 32 weeks   Diastolic blood pressure 90 mm Hg or higher 02/15/2022   Eclampsia 03/07/2013   First-degree perineal laceration, with delivery 03/12/2013   Gasping for breath 02/15/2022   Habitual snoring 10/13/2021   Hypertension    Taking Aldomet 250mg  TID   Palpitations    Pre-eclampsia added to pre-existing hypertension 03/07/2013   Vaginal delivery 03/12/2013    Past Surgical History:  Procedure Laterality Date   NO PAST SURGERIES       Current Outpatient Medications on File Prior to Visit  Medication Sig Dispense Refill   amphetamine-dextroamphetamine (ADDERALL) 10 MG tablet Take 1 tablet (10 mg total) by mouth 2 (two) times daily. 60 tablet 0   clonazePAM (KLONOPIN) 1 MG tablet Take 2 tablets (2 mg total) by mouth as needed for anxiety (seizure). 5 tablet 0    labetalol (NORMODYNE) 200 MG tablet Take 1 tablet (200 mg total) by mouth 2 (two) times daily. Note change in dose of tablet. 180 tablet 1   valACYclovir (VALTREX) 1000 MG tablet Take 1 tablet (1,000 mg total) by mouth daily. (Patient taking differently: Take 1,000 mg by mouth as needed (cold sores).) 90 tablet 3   Vitamin D, Ergocalciferol, (DRISDOL) 1.25 MG (50000 UNIT) CAPS capsule Take 1 capsule (50,000 Units total) by mouth every 7 (seven) days. Take for 12 total doses(weeks) (Patient not taking: Reported on 06/19/2023) 12 capsule 0   No current facility-administered medications on file prior to visit.    No Known Allergies   DIAGNOSTIC DATA (LABS, IMAGING, TESTING) - I  reviewed patient records, labs, notes, testing and imaging myself where available.  Lab Results  Component Value Date   WBC 5.3 04/09/2023   HGB 12.1 04/09/2023   HCT 37.2 04/09/2023   MCV 77.0 (L) 04/09/2023   PLT 307 04/09/2023      Component Value Date/Time   NA 138 04/09/2023 1120   NA 139 11/28/2022 1209   K 3.7 04/09/2023 1120   CL 106 04/09/2023 1120   CO2 24 04/09/2023 1120   GLUCOSE 141 (H) 04/09/2023 1120   BUN 11 04/09/2023 1120   BUN 15 11/28/2022 1209   CREATININE 0.79 04/09/2023 1120   CREATININE 0.64 11/04/2012 0908   CALCIUM 8.8 (L) 04/09/2023 1120   PROT 6.9 04/09/2023 1120   PROT 8.1 11/28/2022 1209   ALBUMIN 3.9 04/09/2023 1120   ALBUMIN 5.2 (H) 11/28/2022 1209   AST 19 04/09/2023 1120   ALT 24 04/09/2023 1120   ALKPHOS 29 (L) 04/09/2023 1120   BILITOT 0.8 04/09/2023 1120   BILITOT 0.5 11/28/2022 1209   GFRNONAA >60 04/09/2023 1120   GFRNONAA >89 11/04/2012 0908   GFRAA >60 01/28/2020 2037   GFRAA >89 11/04/2012 0908   Lab Results  Component Value Date   CHOL 167 11/28/2022   HDL 45 11/28/2022   LDLCALC 100 (H) 11/28/2022   TRIG 121 11/28/2022   CHOLHDL 3.7 11/28/2022   Lab Results  Component Value Date   HGBA1C 5.6 03/02/2023   Lab Results  Component Value Date    VITAMINB12 603 11/28/2022   Lab Results  Component Value Date   TSH 2.820 02/15/2022    PHYSICAL EXAM:  Today's Vitals   06/19/23 1434  BP: (!) 128/94  Pulse: 91  Weight: 165 lb (74.8 kg)  Height: 5\' 6"  (1.676 m)   Body mass index is 26.63 kg/m.   Wt Readings from Last 3 Encounters:  06/19/23 165 lb (74.8 kg)  04/09/23 161 lb (73 kg)  12/21/22 178 lb (80.7 kg)     Ht Readings from Last 3 Encounters:  06/19/23 5\' 6"  (1.676 m)  04/09/23 5\' 6"  (1.676 m)  12/21/22 5\' 7"  (1.702 m)     No physical exam ;   ASSESSMENT AND PLAN 31 y.o. year old female  here with:    1) POST EMU discussion, abnormal  intracerebral vascular formation on MRI can explain some of the symptoms and could be seizure focus. EEG was normal, Breviact was held for EMU and not restarted.   2) Adderall helps with concentration, and is currently used at 10 mg daily . She is interested in XR form, will start at 20 mg daily.  She likes to have her PCP to be prescriber and  not needing q 6 months RV here.   3) RV prn.  I plan to follow up either personally or through our NP within 12 months.    After spending a total time of  25  minutes face to face and additional time for physical and neurologic examination, review of laboratory studies,  personal review of imaging studies, reports and results of other testing and review of referral information / records as far as provided in visit,   Electronically signed by: Melvyn Novas, MD 06/19/2023 3:13 PM  [facility] Board certified by The [employer] of Sleep Medicine and Diplomate of the [employer] of [employer]. Board certified In Neurology through the ABPN, Fellow of the [employer] of [employer].

## 2023-07-17 ENCOUNTER — Other Ambulatory Visit (HOSPITAL_COMMUNITY): Payer: Self-pay

## 2023-07-17 ENCOUNTER — Other Ambulatory Visit: Payer: Self-pay

## 2023-07-17 MED ORDER — DIFLUPREDNATE 0.05 % OP EMUL
1.0000 [drp] | Freq: Three times a day (TID) | OPHTHALMIC | 1 refills | Status: DC
  Filled 2023-07-17: qty 5, 10d supply, fill #0

## 2023-07-17 MED ORDER — GATIFLOXACIN 0.5 % OP SOLN
1.0000 [drp] | Freq: Three times a day (TID) | OPHTHALMIC | 1 refills | Status: DC
  Filled 2023-07-17: qty 2.5, 10d supply, fill #0
  Filled 2023-08-07: qty 2.5, 10d supply, fill #1

## 2023-07-17 MED ORDER — POLYMYXIN B-TRIMETHOPRIM 10000-0.1 UNIT/ML-% OP SOLN
1.0000 [drp] | Freq: Three times a day (TID) | OPHTHALMIC | 1 refills | Status: DC
  Filled 2023-07-17: qty 10, 30d supply, fill #0

## 2023-07-24 ENCOUNTER — Other Ambulatory Visit (HOSPITAL_COMMUNITY): Payer: Self-pay

## 2023-07-24 MED ORDER — FLUOROMETHOLONE 0.1 % OP SUSP
1.0000 [drp] | Freq: Four times a day (QID) | OPHTHALMIC | 3 refills | Status: DC
  Filled 2023-07-24: qty 10, 25d supply, fill #0
  Filled 2023-08-07 – 2023-08-17 (×3): qty 10, 25d supply, fill #1

## 2023-08-07 ENCOUNTER — Other Ambulatory Visit: Payer: Self-pay

## 2023-08-07 ENCOUNTER — Other Ambulatory Visit: Payer: Self-pay | Admitting: Neurology

## 2023-08-08 ENCOUNTER — Other Ambulatory Visit (HOSPITAL_COMMUNITY): Payer: Self-pay

## 2023-08-08 MED ORDER — AMPHETAMINE-DEXTROAMPHET ER 15 MG PO CP24
15.0000 mg | ORAL_CAPSULE | ORAL | 0 refills | Status: DC
  Filled 2023-08-08: qty 30, 30d supply, fill #0

## 2023-08-09 ENCOUNTER — Other Ambulatory Visit: Payer: Self-pay

## 2023-08-13 ENCOUNTER — Other Ambulatory Visit: Payer: Self-pay

## 2023-08-13 ENCOUNTER — Other Ambulatory Visit (HOSPITAL_COMMUNITY): Payer: Self-pay

## 2023-08-17 ENCOUNTER — Other Ambulatory Visit (HOSPITAL_COMMUNITY): Payer: Self-pay

## 2023-08-30 ENCOUNTER — Encounter: Payer: Self-pay | Admitting: Nurse Practitioner

## 2023-10-04 ENCOUNTER — Encounter: Payer: Self-pay | Admitting: Nurse Practitioner

## 2023-10-04 DIAGNOSIS — F988 Other specified behavioral and emotional disorders with onset usually occurring in childhood and adolescence: Secondary | ICD-10-CM

## 2023-10-04 MED ORDER — AMPHETAMINE-DEXTROAMPHETAMINE 20 MG PO TABS
10.0000 mg | ORAL_TABLET | Freq: Two times a day (BID) | ORAL | 0 refills | Status: DC
Start: 2023-10-04 — End: 2023-11-08
  Filled 2023-10-04: qty 60, 30d supply, fill #0

## 2023-10-05 ENCOUNTER — Other Ambulatory Visit (HOSPITAL_COMMUNITY): Payer: Self-pay

## 2023-11-08 ENCOUNTER — Other Ambulatory Visit: Payer: Self-pay | Admitting: Nurse Practitioner

## 2023-11-08 DIAGNOSIS — F988 Other specified behavioral and emotional disorders with onset usually occurring in childhood and adolescence: Secondary | ICD-10-CM

## 2023-11-08 NOTE — Telephone Encounter (Signed)
 Last apt 11/28/22 has CPE coming up soon.

## 2023-11-09 ENCOUNTER — Other Ambulatory Visit (HOSPITAL_COMMUNITY): Payer: Self-pay

## 2023-11-09 MED ORDER — AMPHETAMINE-DEXTROAMPHETAMINE 20 MG PO TABS
10.0000 mg | ORAL_TABLET | Freq: Two times a day (BID) | ORAL | 0 refills | Status: DC
Start: 2023-11-09 — End: 2023-11-30
  Filled 2023-11-09: qty 60, 30d supply, fill #0

## 2023-11-30 ENCOUNTER — Ambulatory Visit: Payer: 59 | Admitting: Nurse Practitioner

## 2023-11-30 ENCOUNTER — Other Ambulatory Visit (HOSPITAL_COMMUNITY): Payer: Self-pay

## 2023-11-30 ENCOUNTER — Encounter: Payer: Self-pay | Admitting: Nurse Practitioner

## 2023-11-30 ENCOUNTER — Other Ambulatory Visit: Payer: Self-pay

## 2023-11-30 VITALS — BP 126/84 | HR 88 | Ht 66.0 in | Wt 159.0 lb

## 2023-11-30 DIAGNOSIS — Z0000 Encounter for general adult medical examination without abnormal findings: Secondary | ICD-10-CM

## 2023-11-30 DIAGNOSIS — Z8249 Family history of ischemic heart disease and other diseases of the circulatory system: Secondary | ICD-10-CM | POA: Diagnosis not present

## 2023-11-30 DIAGNOSIS — Q283 Other malformations of cerebral vessels: Secondary | ICD-10-CM | POA: Diagnosis not present

## 2023-11-30 DIAGNOSIS — D565 Hemoglobin E-beta thalassemia: Secondary | ICD-10-CM | POA: Diagnosis not present

## 2023-11-30 DIAGNOSIS — F902 Attention-deficit hyperactivity disorder, combined type: Secondary | ICD-10-CM

## 2023-11-30 DIAGNOSIS — D582 Other hemoglobinopathies: Secondary | ICD-10-CM

## 2023-11-30 DIAGNOSIS — I10 Essential (primary) hypertension: Secondary | ICD-10-CM | POA: Diagnosis not present

## 2023-11-30 DIAGNOSIS — L308 Other specified dermatitis: Secondary | ICD-10-CM | POA: Diagnosis not present

## 2023-11-30 DIAGNOSIS — E282 Polycystic ovarian syndrome: Secondary | ICD-10-CM

## 2023-11-30 DIAGNOSIS — R569 Unspecified convulsions: Secondary | ICD-10-CM

## 2023-11-30 DIAGNOSIS — F988 Other specified behavioral and emotional disorders with onset usually occurring in childhood and adolescence: Secondary | ICD-10-CM | POA: Diagnosis not present

## 2023-11-30 DIAGNOSIS — Z23 Encounter for immunization: Secondary | ICD-10-CM

## 2023-11-30 DIAGNOSIS — K5900 Constipation, unspecified: Secondary | ICD-10-CM | POA: Insufficient documentation

## 2023-11-30 MED ORDER — TRIAMCINOLONE ACETONIDE 0.1 % EX CREA
1.0000 | TOPICAL_CREAM | Freq: Two times a day (BID) | CUTANEOUS | 0 refills | Status: AC
Start: 2023-11-30 — End: ?
  Filled 2023-11-30: qty 80, 30d supply, fill #0

## 2023-11-30 MED ORDER — LABETALOL HCL 200 MG PO TABS
200.0000 mg | ORAL_TABLET | Freq: Two times a day (BID) | ORAL | 3 refills | Status: DC | PRN
Start: 2023-11-30 — End: 2024-01-09
  Filled 2023-11-30: qty 270, 90d supply, fill #0

## 2023-11-30 MED ORDER — AMPHETAMINE-DEXTROAMPHETAMINE 20 MG PO TABS: 10.0000 mg | ORAL_TABLET | Freq: Two times a day (BID) | ORAL | 0 refills | Status: DC

## 2023-11-30 MED ORDER — AMPHETAMINE-DEXTROAMPHETAMINE 20 MG PO TABS
10.0000 mg | ORAL_TABLET | Freq: Two times a day (BID) | ORAL | 0 refills | Status: DC
  Filled 2023-11-30 – 2024-01-29 (×2): qty 60, 30d supply, fill #0

## 2023-11-30 NOTE — Assessment & Plan Note (Signed)
 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 Experiences intermittent slurred speech, possibly due to overexertion or stress. Neurology recommended anti-seizure medications, not currently used. MRI recommended annually to monitor changes. - Monitor for symptoms - Schedule annual MRI to monitor DVA changes - Follow up with neurology if symptoms worsen

## 2023-11-30 NOTE — Assessment & Plan Note (Signed)
 Chronic. Stable. No alarm symptoms present. Refills provided. OK to refill with annual follow-up as long as dosage does not need altered given that she has remained stable with no concerns.

## 2023-11-30 NOTE — Assessment & Plan Note (Signed)
 Painful bowel movements with hard stools, likely due to slow transit and inadequate hydration. Discussed increasing water intake and incorporating chia seeds to improve stool consistency. - Increase water intake - Consider stool softeners - Incorporate chia seeds into diet

## 2023-11-30 NOTE — Assessment & Plan Note (Signed)

## 2023-11-30 NOTE — Assessment & Plan Note (Signed)
 Persistent itching on left shoulder blade with no visible rash or lesions. Discussed using a topical steroid cream. - Prescribe topical steroid cream

## 2023-11-30 NOTE — Patient Instructions (Addendum)
 If you have another episode of your blood pressure being higher than 150/95 (either number being higher) then take an extra 100mg  (1/2 tab) of the labetalol and let me know. If this is every once in a while, then we can continue this way, but if it is happening more than once or twice a month we need to make changes to the medication. I changed the prescription to allow for the extra tablet as needed.    I have sent the referral to Cardiology for an evaluation.   I have sent in the referrals for your Adderall.

## 2023-11-30 NOTE — Assessment & Plan Note (Signed)
 Intermittent elevated blood pressure, recent readings up to 150/112 mmHg. Symptoms include fatigue and heavy head. Blood pressure improved after relaxation but remained elevated. Currently on labetalol, experiencing random spikes. Discussed taking an extra half tablet during spikes. Consider permanent medication changes if spikes occur more than a couple of times a month. - Monitor blood pressure twice daily for one week - Adjust labetalol dosage to allow for an extra half tablet during spikes - Follow up if blood pressure remains elevated or spikes occur more than a couple of times a month

## 2023-11-30 NOTE — Progress Notes (Signed)
 T-dap: completed COVID: declined  [name], DNP, AGNP-c [facility] [address] [place], [place] [address] Main Office [phone]  BP 126/84   Pulse 88   Ht 5\' 6"  (1.676 m)   Wt 159 lb (72.1 kg)   BMI 25.66 kg/m    Subjective:    Patient ID: Veronica Griffin, female    DOB: 1991-02-11, 33 y.o.   MRN: [id number]  HPI: Veronica Griffin is a 33 y.o. female presenting on 11/30/2023 for comprehensive medical examination.   History of Present Illness Veronica Griffin is a 33 year old female who presents for an annual physical exam.  She has a history of hypertension, generally well-controlled, but experienced significant spikes on Monday and Tuesday with readings of 150/112 mmHg and 140/108 mmHg. During these days, she felt fatigued and unwell, attributing it to her blood pressure. She is on labetalol, taken since pregnancy, and questions its effectiveness as her diastolic pressures remain high.  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 She experiences occasional slurred speech, which may be related to overexertion or stress. She is supposed to have an MRI every one to two years to monitor the anomaly. She was advised to stay on anti-seizure medications but has not been using clonazepam regularly.  She has a history of irregular periods, which have become painful recently. An MRI scan suggested polycystic ovary syndrome (PCOS), showing multiple cysts on both ovaries. Her periods have always been irregular, sometimes skipping months, and she has noticed an increase in cramping.  She reports changes in her bowel habits, with stools becoming very hard and painful to pass, occurring every two to three days. She attributes this to inadequate hydration and has been trying to increase her water intake.  She mentions a persistent itch on her back, specifically on her  shoulder blade, which has been present for a few months. There is no visible rash or scab, but the itch is bothersome, especially at night.  She has a family history of heart issues, as her sister was recently diagnosed with heart failure and restrictive lung disease at the age of 5. This has raised concerns for her own cardiac health, although she has not experienced significant symptoms like shortness of breath or chest pain. She does report occasional heart flutters once or twice a month, which resolve with coughing. No shortness of breath, chest pain, or palpitations.  Pertinent items are noted in HPI.    Most Recent Depression Screen:     11/30/2023    8:37 AM 11/28/2022   11:15 AM 02/15/2022    8:34 AM 10/13/2021    8:42 AM 09/15/2021   12:28 PM  Depression screen PHQ 2/9  Decreased Interest 0 0 0 0 0  Down, Depressed, Hopeless 0 0 0 0 0  PHQ - 2 Score 0 0 0 0 0  Altered sleeping     0  Tired, decreased energy     1  Change in appetite     1  Feeling bad or failure about yourself      0  Trouble concentrating     2  Moving slowly or fidgety/restless     3  Suicidal thoughts     0  PHQ-9 Score     7  Difficult doing work/chores    Not difficult at all Somewhat difficult   Most Recent Anxiety Screen:     09/15/2021  12:28 PM  GAD 7 : Generalized Anxiety Score  Nervous, Anxious, on Edge 2  Control/stop worrying 1  Worry too much - different things 1  Trouble relaxing 1  Restless 1  Easily annoyed or irritable 1  Afraid - awful might happen 0  Total GAD 7 Score 7  Anxiety Difficulty Somewhat difficult   Most Recent Fall Screen:    11/30/2023    8:37 AM 11/28/2022   11:14 AM 10/19/2022   11:42 AM 02/15/2022    8:34 AM 10/13/2021    8:42 AM  Fall Risk   Falls in the past year? 0 0 0 0 0  Number falls in past yr: 0 0 0 0 0  Injury with Fall? 0 0 0 0 0  Risk for fall due to : No Fall Risks No Fall Risks No Fall Risks No Fall Risks No Fall Risks  Follow up Falls  evaluation completed Falls evaluation completed Falls evaluation completed Falls evaluation completed;Education provided Falls evaluation completed    Past medical history, surgical history, medications, allergies, family history and social history reviewed with patient today and changes made to appropriate areas of the chart.  Past Medical History:  Past Medical History:  Diagnosis Date   Antepartum hemorrhage 10/08/2012   Had [place] in MAU at 8 weeks--large [facility]. Repeat US at 10 weeks at [facility]--viable IUP, [facility] noted.   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 (Patient taking differently: Take 1,000 mg by mouth as needed (cold sores).)   clonazePAM (KLONOPIN) 1 MG tablet Take 2 tablets (2 mg total) by mouth as needed for anxiety (seizure). (Patient not taking: Reported on 11/30/2023)   trimethoprim-polymyxin b (POLYTRIM) ophthalmic solution Place 1 drop into both eyes 3 (three) times daily as directed. (Patient not taking: Reported on 11/30/2023)   No current facility-administered medications on file prior to visit.   Surgical History:  Past  Surgical History:  Procedure Laterality Date   NO PAST SURGERIES     Allergies:  No Known Allergies Family History:  Family History  Problem Relation Age of Onset   Hypertension Mother    Hypertension Father    Hypertension Maternal Aunt        Objective:    BP 126/84   Pulse 88   Ht 5\' 6"  (1.676 m)   Wt 159 lb (72.1 kg)   BMI 25.66 kg/m   Wt Readings from Last 3 Encounters:  11/30/23 159 lb (72.1 kg)  06/19/23 165 lb (74.8 kg)  04/09/23 161 lb (73 kg)    Physical Exam Vitals and nursing note reviewed.  Constitutional:  General: She is not in acute distress.    Appearance: Normal appearance.  HENT:     Head: Normocephalic and atraumatic.     Right Ear: Hearing, tympanic membrane, ear canal and external ear normal.     Left Ear: Hearing, tympanic membrane, ear canal and external ear normal.     Nose: Nose normal.     Right Sinus: No maxillary sinus tenderness or frontal sinus tenderness.     Left Sinus: No maxillary sinus tenderness or frontal sinus tenderness.     Mouth/Throat:     Lips: Pink.     Mouth: Mucous membranes are moist.     Pharynx: Oropharynx is clear.  Eyes:     General: Lids are normal. Vision grossly intact.     Extraocular Movements: Extraocular movements intact.     Conjunctiva/sclera: Conjunctivae normal.     Pupils: Pupils are equal, round, and reactive to light.     Funduscopic exam:    Right eye: Red reflex present.        Left eye: Red reflex present.    Visual Fields: Right eye visual fields normal and left eye visual fields normal.  Neck:     Thyroid: No thyromegaly.     Vascular: No carotid bruit.  Cardiovascular:     Rate and Rhythm: Normal rate and regular rhythm.     Chest Wall: PMI is not displaced.     Pulses: Normal pulses.          Dorsalis pedis pulses are 2+ on the right side and 2+ on the left side.       Posterior tibial pulses are 2+ on the right side and 2+ on the left side.     Heart sounds: Normal heart sounds.  No murmur heard. Pulmonary:     Effort: Pulmonary effort is normal. No respiratory distress.     Breath sounds: Normal breath sounds.  Abdominal:     General: Abdomen is flat. Bowel sounds are normal. There is no distension.     Palpations: Abdomen is soft. There is no hepatomegaly, splenomegaly or mass.     Tenderness: There is no abdominal tenderness. There is no right CVA tenderness, left CVA tenderness, guarding or rebound.  Musculoskeletal:        General: Normal range of motion.     Cervical back: Full passive range of motion without pain, normal range of motion and neck supple. No tenderness.     Right lower leg: No edema.     Left lower leg: No edema.  Feet:     Left foot:     Toenail Condition: Left toenails are normal.  Lymphadenopathy:     Cervical: No cervical adenopathy.     Upper Body:     Right upper body: No supraclavicular adenopathy.     Left upper body: No supraclavicular adenopathy.  Skin:    General: Skin is warm and dry.     Capillary Refill: Capillary refill takes less than 2 seconds.     Nails: There is no clubbing.  Neurological:     General: No focal deficit present.     Mental Status: She is alert and oriented to person, place, and time.     GCS: GCS eye subscore is 4. GCS verbal subscore is 5. GCS motor subscore is 6.     Sensory: Sensation is intact.     Motor: Motor function is intact.     Coordination: Coordination is intact.  Gait: Gait is intact.     Deep Tendon Reflexes: Reflexes are normal and symmetric.  Psychiatric:        Attention and Perception: Attention normal.        Mood and Affect: Mood normal.        Speech: Speech normal.        Behavior: Behavior normal. Behavior is cooperative.        Thought Content: Thought content normal.        Cognition and Memory: Cognition and memory normal.        Judgment: Judgment normal.      Results for orders placed or performed during the hospital encounter of 04/09/23  HIV Antibody  (routine testing w rflx)   Collection Time: 04/09/23 11:20 AM  Result Value Ref Range   HIV Screen 4th Generation wRfx Non Reactive Non Reactive  CBC with Differential/Platelet   Collection Time: 04/09/23 11:20 AM  Result Value Ref Range   WBC 5.3 4.0 - 10.5 K/uL   RBC 4.83 3.87 - 5.11 MIL/uL   Hemoglobin 12.1 12.0 - 15.0 g/dL   HCT 32.9 51.8 - 84.1 %   MCV 77.0 (L) 80.0 - 100.0 fL   MCH 25.1 (L) 26.0 - 34.0 pg   MCHC 32.5 30.0 - 36.0 g/dL   RDW 66.0 63.0 - 16.0 %   Platelets 307 150 - 400 K/uL   nRBC 0.0 0.0 - 0.2 %   Neutrophils Relative % 66 %   Neutro Abs 3.5 1.7 - 7.7 K/uL   Lymphocytes Relative 29 %   Lymphs Abs 1.5 0.7 - 4.0 K/uL   Monocytes Relative 4 %   Monocytes Absolute 0.2 0.1 - 1.0 K/uL   Eosinophils Relative 1 %   Eosinophils Absolute 0.0 0.0 - 0.5 K/uL   Basophils Relative 0 %   Basophils Absolute 0.0 0.0 - 0.1 K/uL   Immature Granulocytes 0 %   Abs Immature Granulocytes 0.02 0.00 - 0.07 K/uL  Comprehensive metabolic panel   Collection Time: 04/09/23 11:20 AM  Result Value Ref Range   Sodium 138 135 - 145 mmol/L   Potassium 3.7 3.5 - 5.1 mmol/L   Chloride 106 98 - 111 mmol/L   CO2 24 22 - 32 mmol/L   Glucose, Bld 141 (H) 70 - 99 mg/dL   BUN 11 6 - 20 mg/dL   Creatinine, Ser 1.09 0.44 - 1.00 mg/dL   Calcium 8.8 (L) 8.9 - 10.3 mg/dL   Total Protein 6.9 6.5 - 8.1 g/dL   Albumin 3.9 3.5 - 5.0 g/dL   AST 19 15 - 41 U/L   ALT 24 0 - 44 U/L   Alkaline Phosphatase 29 (L) 38 - 126 U/L   Total Bilirubin 0.8 0.3 - 1.2 mg/dL   GFR, Estimated >32 >35 mL/min   Anion gap 8 5 - 15  Magnesium   Collection Time: 04/09/23 11:20 AM  Result Value Ref Range   Magnesium 1.9 1.7 - 2.4 mg/dL  Phosphorus   Collection Time: 04/09/23 11:20 AM  Result Value Ref Range   Phosphorus 2.8 2.5 - 4.6 mg/dL  Protime-INR   Collection Time: 04/09/23 11:20 AM  Result Value Ref Range   Prothrombin Time 13.8 11.4 - 15.2 seconds   INR 1.0 0.8 - 1.2  Rapid urine drug screen (hospital  performed)   Collection Time: 04/09/23  1:42 PM  Result Value Ref Range   Opiates NONE DETECTED NONE DETECTED   Cocaine NONE DETECTED NONE DETECTED  Benzodiazepines NONE DETECTED NONE DETECTED   Amphetamines NONE DETECTED NONE DETECTED   Tetrahydrocannabinol NONE DETECTED NONE DETECTED   Barbiturates NONE DETECTED NONE DETECTED  Glucose, capillary   Collection Time: 04/09/23  8:08 PM  Result Value Ref Range   Glucose-Capillary 97 70 - 99 mg/dL   Comment 1 Notify RN    Comment 2 Document in Chart        Assessment & Plan:   Problem List Items Addressed This Visit     Hemoglobin E-beta thalassemia (HCC)   Chronic.  No alarm symptoms present today.  We will obtain labs for evaluation and monitoring.      RESOLVED: Hemoglobinopathy (HCC)   Relevant Orders   CBC with Differential/Platelet   CMP14+EGFR   Hemoglobin A1c   Lipid panel   TSH   Primary hypertension   Intermittent elevated blood pressure, recent readings up to 150/112 mmHg. Symptoms include fatigue and heavy head. Blood pressure improved after relaxation but remained elevated. Currently on labetalol, experiencing random spikes. Discussed taking an extra half tablet during spikes. Consider permanent medication changes if spikes occur more than a couple of times a month. - Monitor blood pressure twice daily for one week - Adjust labetalol dosage to allow for an extra half tablet during spikes - Follow up if blood pressure remains elevated or spikes occur more than a couple of times a month      Relevant Medications   labetalol (NORMODYNE) 200 MG tablet   Other Relevant Orders   CBC with Differential/Platelet   CMP14+EGFR   Hemoglobin A1c   Lipid panel   TSH   Ambulatory referral to Cardiology   Attention deficit hyperactivity disorder (ADHD), combined type   Chronic. Stable. No alarm symptoms present. Refills provided. OK to refill with annual follow-up as long as dosage does not need altered given that she has  remained stable with no concerns.       Encounter for annual physical exam - Primary   CPE completed today. Review of HM activities and recommendations discussed and provided on AVS. Anticipatory guidance, diet, and exercise recommendations provided. Medications, allergies, and hx reviewed and updated as necessary. Orders placed as listed below.  Plan: - Labs ordered. Will make changes as necessary based on results.  - I will review these results and send recommendations via MyChart or a telephone call.  - F/U with CPE in 1 year or sooner for acute/chronic health needs as directed.        Relevant Orders   CBC with Differential/Platelet   CMP14+EGFR   Hemoglobin A1c   Lipid panel   TSH   Seizure (HCC)   Stable with no alarm symptoms present at this time. Recommend close monitoring of blood pressure and adjustment of medications accordingly based on average numbers due to the venous malformation and concern this is trigger the events. Continue to follow annually with neurology.       Relevant Orders   CBC with Differential/Platelet   CMP14+EGFR   Hemoglobin A1c   Lipid panel   TSH   Cerebrovascular dural venous malformation   Moderate-sized DVA in the left frontal lobe's Broca's area. Experiences intermittent slurred speech, possibly due to overexertion or stress. Neurology recommended anti-seizure medications, not currently used. MRI recommended annually to monitor changes. - Monitor for symptoms - Schedule annual MRI to monitor DVA changes - Follow up with neurology if symptoms worsen      Relevant Medications   labetalol (NORMODYNE) 200 MG tablet  PCOS (polycystic ovarian syndrome)   Irregular periods, increased menstrual pain, multiple ovarian cysts on imaging viewed on patients phone. Discussed impact on fertility and hormone levels. No current treatment preference. Explained birth control can regulate periods and spironolactone can block testosterone if symptoms worsen. -  Monitor blood sugars and hormone levels - Consider birth control or spironolactone if symptoms worsen or patient desires treatment - Follow up with OB/GYN every six months       Relevant Orders   CBC with Differential/Platelet   CMP14+EGFR   Hemoglobin A1c   Lipid panel   TSH   Pruritic dermatitis   Persistent itching on left shoulder blade with no visible rash or lesions. Discussed using a topical steroid cream. - Prescribe topical steroid cream       Relevant Medications   triamcinolone cream (KENALOG) 0.1 %   Family history of acute heart failure   Sister diagnosed with acute heart failure last year at age 27 with no known etiology. Unclear if this is a genetic concern. Patient experiences occasional palpitations and a history of LE edema, but the edema has improved. No alarm symptoms present. Discussed recommendation for further evaluation given the young age of diagnosis in sister and proactive approach. She is in agreement.  - Referral to cardiology for monitoring      Relevant Orders   CBC with Differential/Platelet   CMP14+EGFR   Hemoglobin A1c   Lipid panel   TSH   Ambulatory referral to Cardiology   Constipation   Painful bowel movements with hard stools, likely due to slow transit and inadequate hydration. Discussed increasing water intake and incorporating chia seeds to improve stool consistency. - Increase water intake - Consider stool softeners - Incorporate chia seeds into diet      Attention deficit disorder (ADD) without hyperactivity   Relevant Medications   amphetamine-dextroamphetamine (ADDERALL) 20 MG tablet   amphetamine-dextroamphetamine (ADDERALL) 20 MG tablet (Start on 12/28/2023)   amphetamine-dextroamphetamine (ADDERALL) 20 MG tablet (Start on 01/25/2024)   Other Relevant Orders   CBC with Differential/Platelet   CMP14+EGFR   Hemoglobin A1c   Lipid panel   TSH      Follow up plan: Return in about 1 year (around 11/29/2024) for CPE.  NEXT  PREVENTATIVE PHYSICAL DUE IN 1 YEAR.  PATIENT COUNSELING PROVIDED FOR ALL ADULT PATIENTS: A well balanced diet low in saturated fats, cholesterol, and moderation in carbohydrates.  This can be as simple as monitoring portion sizes and cutting back on sugary beverages such as soda and juice to start with.    Daily water consumption of at least 64 ounces.  Physical activity at least 180 minutes per week.  If just starting out, start 10 minutes a day and work your way up.   This can be as simple as taking the stairs instead of the elevator and walking 2-3 laps around the office  purposefully every day.   STD protection, partner selection, and regular testing if high risk.  Limited consumption of alcoholic beverages if alcohol is consumed. For men, I recommend no more than 14 alcoholic beverages per week, spread out throughout the week (max 2 per day). Avoid "binge" drinking or consuming large quantities of alcohol in one setting.  Please let me know if you feel you may need help with reduction or quitting alcohol consumption.   Avoidance of nicotine, if used. Please let me know if you feel you may need help with reduction or quitting nicotine use.   Daily mental health  attention. This can be in the form of 5 minute daily meditation, prayer, journaling, yoga, reflection, etc.  Purposeful attention to your emotions and mental state can significantly improve your overall wellbeing  and  Health.  Please know that I am here to help you with all of your health care goals and am happy to work with you to find a solution that works best for you.  The greatest advice I have received with any changes in life are to take it one step at a time, that even means if all you can focus on is the next 60 seconds, then do that and celebrate your victories.  With any changes in life, you will have set backs, and that is OK. The important thing to remember is, if you have a set back, it is not a failure, it is  an opportunity to try again! Screening Testing Mammogram Every 1 -2 years based on history and risk factors Starting at age 43 Pap Smear Ages 21-39 every 3 years Ages 28-65 every 5 years with HPV testing More frequent testing may be required based on results and history Colon Cancer Screening Every 1-10 years based on test performed, risk factors, and history Starting at age 33 Bone Density Screening Every 2-10 years based on history Starting at age 71 for women Recommendations for men differ based on medication usage, history, and risk factors AAA Screening One time ultrasound Men 35-84 years old who have every smoked Lung Cancer Screening Low Dose Lung CT every 12 months Age 33-80 years with a 30 pack-year smoking history who still smoke or who have quit within the last 15 years   Screening Labs Routine  Labs: Complete Blood Count (CBC), Complete Metabolic Panel (CMP), Cholesterol (Lipid Panel) Every 6-12 months based on history and medications May be recommended more frequently based on current conditions or previous results Hemoglobin A1c Lab Every 3-12 months based on history and previous results Starting at age 21 or earlier with diagnosis of diabetes, high cholesterol, BMI >26, and/or risk factors Frequent monitoring for patients with diabetes to ensure blood sugar control Thyroid Panel (TSH) Every 6 months based on history, symptoms, and risk factors May be repeated more often if on medication HIV One time testing for all patients 32 and older May be repeated more frequently for patients with increased risk factors or exposure Hepatitis C One time testing for all patients 72 and older May be repeated more frequently for patients with increased risk factors or exposure Gonorrhea, Chlamydia Every 12 months for all sexually active persons 13-24 years Additional monitoring may be recommended for those who are considered high risk or who have symptoms Every 12 months  for any woman on birth control, regardless of sexual activity PSA Men 6-27 years old with risk factors Additional screening may be recommended from age 51-69 based on risk factors, symptoms, and history  Vaccine Recommendations Tetanus Booster All adults every 10 years Flu Vaccine All patients 6 months and older every year COVID Vaccine All patients 12 years and older Initial dosing with booster May recommend additional booster based on age and health history HPV Vaccine 2 doses all patients age 59-26 Dosing may be considered for patients over 26 Shingles Vaccine (Shingrix) 2 doses all adults 55 years and older Pneumonia (Pneumovax 67) All adults 65 years and older May recommend earlier dosing based on health history One year apart from Prevnar 39 Pneumonia (Prevnar 76) All adults 65 years and older Dosed 1 year after Pneumovax  23 Pneumonia (Prevnar 20) One time alternative to the two dosing of 13 and 23 For all adults with initial dose of 23, 20 is recommended 1 year later For all adults with initial dose of 13, 23 is still recommended as second option 1 year later

## 2023-11-30 NOTE — Assessment & Plan Note (Signed)
 Sister diagnosed with acute heart failure last year at age 33 with no known etiology. Unclear if this is a genetic concern. Patient experiences occasional palpitations and a history of LE edema, but the edema has improved. No alarm symptoms present. Discussed recommendation for further evaluation given the young age of diagnosis in sister and proactive approach. She is in agreement.  - Referral to cardiology for monitoring

## 2023-11-30 NOTE — Assessment & Plan Note (Signed)
 Stable with no alarm symptoms present at this time. Recommend close monitoring of blood pressure and adjustment of medications accordingly based on average numbers due to the venous malformation and concern this is trigger the events. Continue to follow annually with neurology.

## 2023-11-30 NOTE — Assessment & Plan Note (Signed)
 Irregular periods, increased menstrual pain, multiple ovarian cysts on imaging viewed on patients phone. Discussed impact on fertility and hormone levels. No current treatment preference. Explained birth control can regulate periods and spironolactone can block testosterone if symptoms worsen. - Monitor blood sugars and hormone levels - Consider birth control or spironolactone if symptoms worsen or patient desires treatment - Follow up with OB/GYN every six months

## 2023-11-30 NOTE — Assessment & Plan Note (Signed)
 Chronic.  No alarm symptoms present today.  We will obtain labs for evaluation and monitoring.

## 2023-12-01 LAB — CBC WITH DIFFERENTIAL/PLATELET
Basophils Absolute: 0 10*3/uL (ref 0.0–0.2)
Basos: 1 %
EOS (ABSOLUTE): 0.1 10*3/uL (ref 0.0–0.4)
Eos: 2 %
Hematocrit: 40.1 % (ref 34.0–46.6)
Hemoglobin: 13.2 g/dL (ref 11.1–15.9)
Immature Grans (Abs): 0 10*3/uL (ref 0.0–0.1)
Immature Granulocytes: 0 %
Lymphocytes Absolute: 1.4 10*3/uL (ref 0.7–3.1)
Lymphs: 35 %
MCH: 24.5 pg — ABNORMAL LOW (ref 26.6–33.0)
MCHC: 32.9 g/dL (ref 31.5–35.7)
MCV: 75 fL — ABNORMAL LOW (ref 79–97)
Monocytes Absolute: 0.3 10*3/uL (ref 0.1–0.9)
Monocytes: 7 %
Neutrophils Absolute: 2.2 10*3/uL (ref 1.4–7.0)
Neutrophils: 55 %
Platelets: 345 10*3/uL (ref 150–450)
RBC: 5.38 x10E6/uL — ABNORMAL HIGH (ref 3.77–5.28)
RDW: 13.9 % (ref 11.7–15.4)
WBC: 3.9 10*3/uL (ref 3.4–10.8)

## 2023-12-01 LAB — CMP14+EGFR
ALT: 13 IU/L (ref 0–32)
AST: 11 IU/L (ref 0–40)
Albumin: 4.8 g/dL (ref 3.9–4.9)
Alkaline Phosphatase: 44 IU/L (ref 44–121)
BUN/Creatinine Ratio: 25 — ABNORMAL HIGH (ref 9–23)
BUN: 22 mg/dL — ABNORMAL HIGH (ref 6–20)
Bilirubin Total: 0.4 mg/dL (ref 0.0–1.2)
CO2: 21 mmol/L (ref 20–29)
Calcium: 9.5 mg/dL (ref 8.7–10.2)
Chloride: 103 mmol/L (ref 96–106)
Creatinine, Ser: 0.89 mg/dL (ref 0.57–1.00)
Globulin, Total: 2.4 g/dL (ref 1.5–4.5)
Glucose: 103 mg/dL — ABNORMAL HIGH (ref 70–99)
Potassium: 4.7 mmol/L (ref 3.5–5.2)
Sodium: 139 mmol/L (ref 134–144)
Total Protein: 7.2 g/dL (ref 6.0–8.5)
eGFR: 88 mL/min/{1.73_m2} (ref >59)

## 2023-12-01 LAB — LIPID PANEL
Chol/HDL Ratio: 2.3 ratio (ref 0.0–4.4)
Cholesterol, Total: 145 mg/dL (ref 100–199)
HDL: 62 mg/dL (ref >39)
LDL Chol Calc (NIH): 74 mg/dL (ref 0–99)
Triglycerides: 38 mg/dL (ref 0–149)
VLDL Cholesterol Cal: 9 mg/dL (ref 5–40)

## 2023-12-01 LAB — HEMOGLOBIN A1C
Est. average glucose Bld gHb Est-mCnc: 114 mg/dL
Hgb A1c MFr Bld: 5.6 % (ref 4.8–5.6)

## 2023-12-01 LAB — TSH: TSH: 2.28 u[IU]/mL (ref 0.450–4.500)

## 2023-12-03 ENCOUNTER — Other Ambulatory Visit (HOSPITAL_COMMUNITY): Payer: Self-pay

## 2023-12-06 ENCOUNTER — Encounter: Payer: Self-pay | Admitting: Nurse Practitioner

## 2023-12-14 ENCOUNTER — Encounter: Payer: Self-pay | Admitting: Nurse Practitioner

## 2023-12-25 ENCOUNTER — Other Ambulatory Visit: Payer: Self-pay | Admitting: Emergency Medicine

## 2023-12-25 DIAGNOSIS — L089 Local infection of the skin and subcutaneous tissue, unspecified: Secondary | ICD-10-CM

## 2023-12-25 MED ORDER — DOXYCYCLINE HYCLATE 100 MG PO TABS
100.0000 mg | ORAL_TABLET | Freq: Two times a day (BID) | ORAL | 0 refills | Status: AC
Start: 2023-12-25 — End: 2024-01-01

## 2024-01-04 NOTE — Progress Notes (Signed)
 Cardiology Office Note:   Date:  01/09/2024  ID:  Veronica Griffin, DOB 04-17-1991, MRN [id number] PCP:  Veronica Eth, NP  [facility] Providers Cardiologist:  Alverda Skeans, MD Referring MD: Veronica Eth, NP  Chief Complaint/Reason for Referral: Family history of cardiomyopathy ASSESSMENT:    1. Family history of cardiomyopathy   2. Dyspnea on exertion   3. Primary hypertension   4. CKD (chronic kidney disease) stage 2, GFR 60-89 ml/min     PLAN:   In order of problems listed above: Family history of cardiomyopathy: Will obtain echocardiogram to evaluate further.  If reassuring no further evaluation is required. Dyspnea: We will obtain a coronary CTA and echocardiogram to evaluate further.  If the patient has mild obstructive coronary artery disease, they will require a statin (with goal LDL < 70) and aspirin, if they have high-grade disease we will need to consider optimal medical therapy and if symptoms are refractory to medical therapy, then a cardiac catheterization with possible PCI will be pursued to alleviate symptoms.  If they have high risk disease we will proceed directly to cardiac catheterization.   Hypertension: The patient has been on labetalol since she was pregnant.  I do not think this is a great medication for her especially in the presence of CKD stage II.  Will stop labetalol, start Micardis 40 mg and check BMP next week.  Will have patient return for blood pressure check.  If blood pressure is above 130/80 would recommend increasing Micardis to 80 mg. CKD stage II: Start Micardis 40 mg as detailed above.            Dispo:  Return in about 6 months (around 07/10/2024).      Medication Adjustments/Labs and Tests Ordered: Current medicines are reviewed at length with the patient today.  Concerns regarding medicines are outlined above.  The following changes have been made:     Labs/tests ordered: Orders Placed This Encounter  Procedures   CT CORONARY  MORPH W/CTA COR W/SCORE W/CA W/CM &/OR WO/CM   Basic metabolic panel with GFR   EKG [id number]   ECHOCARDIOGRAM COMPLETE    Medication Changes: Meds ordered this encounter  Medications   telmisartan (MICARDIS) 40 MG tablet    Sig: Take 1 tablet (40 mg total) by mouth daily.    Dispense:  30 tablet    Refill:  3   metoprolol tartrate (LOPRESSOR) 100 MG tablet    Sig: Take 1 tablet (100 mg total) by mouth once for 1 dose. Take 90-120 minutes prior to scan. Hold for SBP less than 110.    Dispense:  1 tablet    Refill:  0    Current medicines are reviewed at length with the patient today.  The patient does not have concerns regarding medicines.    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 The patient is a 33 year old female with above listed medical problems referred by her PCP due to family history of cardiomyopathy.  The patient has a long history of seizure disorder.  She is followed by neurology.  She was seen by her PCP recently she was doing well without cardiovascular complaints.  Her blood pressure is well-controlled.  Due to her family history she is referred for further recommendations.  She experiences increased shortness of breath and fatigue, which have recently worsened. Previously active, she now finds exercise straining. She also notes swelling in her legs and fingers. Despite a 20-pound weight loss over the past year, attributed to Adderall use, her shortness of breath persists.  She has a history of hypertension since high school, with previous readings as high as 145/100. Currently on Labetalol, prescribed during a period when she was trying to get pregnant, her  blood pressure was previously as high as 180/100 but has improved to around 135/100. She experienced drowsiness with previous medications such as amlodipine and atenolol.  She experiences occasional palpitations described as 'flutters' occurring once or twice a month. No chest discomfort during exercise, but she reports lightheadedness when squatting and standing up. No episodes of blacking out, bleeding, or significant chest pain.  Her sister has a history of heart failure that was noticed around pregnancy, raising concerns about familial cardiac issues.  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 She vapes and is trying to quit.        Current Medications: Current Meds  Medication Sig   Albuterol-Budesonide (AIRSUPRA) 90-80 MCG/ACT AERO Inhale 2 Inhalations into the lungs 4 (four) times daily as needed.   amphetamine-dextroamphetamine (ADDERALL) 20 MG tablet Take 1/2-1 tablet (10-20 mg total) by mouth 2 (two) times daily.   amphetamine-dextroamphetamine (ADDERALL) 20 MG tablet Take 1/2-1 tablet (10-20 mg total) by mouth 2 (two) times daily.   [START ON 01/25/2024] amphetamine-dextroamphetamine (ADDERALL) 20 MG tablet Take 1/2-1 tablet (10-20 mg total) by mouth 2 (two) times daily.   azithromycin (ZITHROMAX) 250 MG tablet Take 2 tablets (500 mg total) by mouth daily for 1 day, THEN 1 tablet (250 mg total) daily for 4 days.   cetirizine (ZYRTEC) 10 MG tablet Take 1 tablet (10 mg total) by mouth daily.   fluorometholone (FML) 0.1 % ophthalmic suspension Place 1 drop into both eyes 4 (four) times daily as directed   HYDROcodone bit-homatropine (HYCODAN) 5-1.5 MG/5ML syrup Take 5 mLs by mouth every 6 (six) hours as needed for up to 10 days.   metoprolol tartrate (LOPRESSOR) 100 MG tablet Take 1 tablet (100 mg total) by mouth once for 1 dose. Take 90-120 minutes prior to scan. Hold for SBP  less than 110.   predniSONE (DELTASONE) 10 MG tablet Take 3 tablets (30 mg total) by mouth daily for 3 days, THEN 2 tablets (20 mg total) daily for 3 days, THEN 1 tablet (10 mg total) daily for 3 days.   telmisartan (MICARDIS) 40 MG tablet Take 1 tablet (40 mg total) by mouth daily.   triamcinolone cream (KENALOG) 0.1 % Apply 1 Application topically 2 (two) times daily. Use for at least 1 week on area of itching then stop to see if this resolves. May repeat as needed.   valACYclovir (VALTREX) 1000 MG tablet Take 1 tablet (1,000 mg total) by mouth daily.   [DISCONTINUED] labetalol (NORMODYNE) 200 MG tablet Take 1 tablet (200mg ) by mouth twice a day. If blood pressure is higher than 150/95 take extra 1/2 tablet (100mg ) up to twice a day.     Review of Systems:   Please see the history of present illness.    All other systems reviewed and are negative.     EKGs/Labs/Other Test Reviewed:   EKG: EKG from  July 2024 demonstrates sinus rhythm  EKG Interpretation Date/Time:  Wednesday January 09 2024 15:51:04 EDT Ventricular Rate:  88 PR Interval:  152 QRS Duration:  92 QT Interval:  366 QTC Calculation: 442 R Axis:   82  Text Interpretation: Normal sinus rhythm Nonspecific T wave abnormality When compared with ECG of 09-Apr-2023 09:36, Nonspecific T wave abnormality now evident in Lateral leads Confirmed by Alverda Skeans ([id number]) on 01/09/2024 3:55:30 PM         Risk Assessment/Calculations:          Physical Exam:   VS:  BP (!) 136/100   Pulse 86   Ht 5\' 6"  (1.676 m)   Wt 155 lb (70.3 kg)   SpO2 97%   BMI 25.02 kg/m    HYPERTENSION CONTROL Vitals:   01/09/24 1547  BP: (!) 136/100    The patient's blood pressure is elevated above target today.  In order to address the patient's elevated BP: A new medication was prescribed today.      Wt Readings from Last 3 Encounters:  01/09/24 155 lb (70.3 kg)  01/09/24 159 lb (72.1 kg)  11/30/23 159 lb (72.1 kg)      GENERAL:  No  apparent distress, AOx3 HEENT:  No carotid bruits, +2 carotid impulses, no scleral icterus CAR: RRR no murmurs, gallops, rubs, or thrills RES:  Clear to auscultation bilaterally ABD:  Soft, nontender, nondistended, positive bowel sounds x 4 VASC:  +2 radial pulses, +2 carotid pulses NEURO:  CN 2-12 grossly intact; motor and sensory grossly intact PSYCH:  No active depression or anxiety EXT:  No edema, ecchymosis, or cyanosis  Signed, Orbie Pyo, MD  01/09/2024 4:47 PM    [facility] [address] [place], [place], [place]  [address] Phone: [phone]; Fax: [phone]   Note:  This document was prepared using Dragon voice recognition software and may include unintentional dictation errors.

## 2024-01-09 ENCOUNTER — Ambulatory Visit: Attending: Internal Medicine | Admitting: Internal Medicine

## 2024-01-09 ENCOUNTER — Encounter: Payer: Self-pay | Admitting: Internal Medicine

## 2024-01-09 ENCOUNTER — Other Ambulatory Visit (HOSPITAL_COMMUNITY): Payer: Self-pay

## 2024-01-09 ENCOUNTER — Ambulatory Visit (INDEPENDENT_AMBULATORY_CARE_PROVIDER_SITE_OTHER): Admitting: Internal Medicine

## 2024-01-09 VITALS — BP 136/100 | HR 86 | Ht 66.0 in | Wt 155.0 lb

## 2024-01-09 VITALS — BP 130/76 | HR 87 | Temp 98.7°F | Ht 66.0 in | Wt 159.0 lb

## 2024-01-09 DIAGNOSIS — E559 Vitamin D deficiency, unspecified: Secondary | ICD-10-CM

## 2024-01-09 DIAGNOSIS — R051 Acute cough: Secondary | ICD-10-CM | POA: Diagnosis not present

## 2024-01-09 DIAGNOSIS — Z8249 Family history of ischemic heart disease and other diseases of the circulatory system: Secondary | ICD-10-CM

## 2024-01-09 DIAGNOSIS — R0609 Other forms of dyspnea: Secondary | ICD-10-CM | POA: Diagnosis not present

## 2024-01-09 DIAGNOSIS — T7840XA Allergy, unspecified, initial encounter: Secondary | ICD-10-CM

## 2024-01-09 DIAGNOSIS — N182 Chronic kidney disease, stage 2 (mild): Secondary | ICD-10-CM

## 2024-01-09 DIAGNOSIS — J302 Other seasonal allergic rhinitis: Secondary | ICD-10-CM | POA: Diagnosis not present

## 2024-01-09 DIAGNOSIS — R062 Wheezing: Secondary | ICD-10-CM | POA: Diagnosis not present

## 2024-01-09 DIAGNOSIS — I10 Essential (primary) hypertension: Secondary | ICD-10-CM

## 2024-01-09 MED ORDER — AZITHROMYCIN 250 MG PO TABS
ORAL_TABLET | ORAL | 1 refills | Status: AC
  Filled 2024-01-09: qty 6, 5d supply, fill #0

## 2024-01-09 MED ORDER — CETIRIZINE HCL 10 MG PO TABS
10.0000 mg | ORAL_TABLET | Freq: Every day | ORAL | 11 refills | Status: AC
  Filled 2024-01-09: qty 100, 100d supply, fill #0

## 2024-01-09 MED ORDER — METHYLPREDNISOLONE ACETATE 80 MG/ML IJ SUSP
80.0000 mg | Freq: Once | INTRAMUSCULAR | Status: AC
  Administered 2024-01-09: 80 mg via INTRAMUSCULAR

## 2024-01-09 MED ORDER — TELMISARTAN 40 MG PO TABS
40.0000 mg | ORAL_TABLET | Freq: Every day | ORAL | 3 refills | Status: DC
  Filled 2024-01-09: qty 30, 30d supply, fill #0
  Filled 2024-02-17: qty 30, 30d supply, fill #1
  Filled 2024-03-17: qty 30, 30d supply, fill #2
  Filled 2024-04-15: qty 30, 30d supply, fill #3

## 2024-01-09 MED ORDER — PREDNISONE 10 MG PO TABS
ORAL_TABLET | ORAL | 0 refills | Status: AC
  Filled 2024-01-09: qty 18, 9d supply, fill #0

## 2024-01-09 MED ORDER — AIRSUPRA 90-80 MCG/ACT IN AERO
2.0000 | INHALATION_SPRAY | Freq: Four times a day (QID) | RESPIRATORY_TRACT | 3 refills | Status: AC | PRN
  Filled 2024-01-09: qty 10.7, 30d supply, fill #0

## 2024-01-09 MED ORDER — METOPROLOL TARTRATE 100 MG PO TABS
100.0000 mg | ORAL_TABLET | Freq: Once | ORAL | 0 refills | Status: DC
  Filled 2024-01-09: qty 1, 1d supply, fill #0

## 2024-01-09 MED ORDER — HYDROCODONE BIT-HOMATROP MBR 5-1.5 MG/5ML PO SOLN
5.0000 mL | Freq: Four times a day (QID) | ORAL | 0 refills | Status: AC | PRN
  Filled 2024-01-09: qty 180, 9d supply, fill #0

## 2024-01-09 NOTE — Patient Instructions (Signed)
 You had the steroid shot today  Please take all new medication as prescribed - the zpack. Prednisone, cough medicine as needed, Airsupra inhaler as needed, and zyrtec 10 mg  Please continue all other medications as before, and refills have been done if requested.  Please have the pharmacy call with any other refills you may need.  Please keep your appointments with your specialists as you may have planned

## 2024-01-09 NOTE — Patient Instructions (Addendum)
 Medication Instructions:  Your physician has recommended you make the following change in your medication:   STOP Labetalol  START Telmisartan (Micardis) 40 mg once daily    ONCE- Take Metoprolol Tartrate (Lopressor) 100 mg ONCE 2 hours prior to scheduled coronary CTA.  *If you need a refill on your cardiac medications before your next appointment, please call your pharmacy*  Lab Work: To be completed in 1 week: BMP  If you have labs (blood work) drawn today and your tests are completely normal, you will receive your results only by: MyChart Message (if you have MyChart) OR A paper copy in the mail If you have any lab test that is abnormal or we need to change your treatment, we will call you to review the results.  Testing/Procedures: Your physician has requested that you have cardiac CTA. Cardiac computed tomography (CT) is a painless test that uses an x-ray machine to take clear, detailed pictures of your heart. For further information please visit [email or web address] Please follow instruction sheet as given.    Your physician has requested that you have an echocardiogram. Echocardiography is a painless test that uses sound waves to create images of your heart. It provides your doctor with information about the size and shape of your heart and how well your heart's chambers and valves are working. This procedure takes approximately one hour. There are no restrictions for this procedure. Please do NOT wear cologne, perfume, aftershave, or lotions (deodorant is allowed). Please arrive 15 minutes prior to your appointment time.  Please note: We ask at that you not bring children with you during ultrasound (echo/ vascular) testing. Due to room size and safety concerns, children are not allowed in the ultrasound rooms during exams. Our front office staff cannot provide observation of children in our lobby area while testing is being conducted. An adult accompanying a patient to their  appointment will only be allowed in the ultrasound room at the discretion of the ultrasound technician under special circumstances. We apologize for any inconvenience.   Follow-Up: At [facility], you and your health needs are our priority.  As part of our continuing mission to provide you with exceptional heart care, we have created designated Provider Care Teams.  These Care Teams include your primary Cardiologist (physician) and Advanced Practice Providers (APPs -  Physician Assistants and Nurse Practitioners) who all work together to provide you with the care you need, when you need it.  Your next appointment:   1 week nurse visit to check blood pressure- 4/16 at 11 AM  6 month(s)  The format for your next appointment:   In Person  Provider:   Orbie Pyo, MD{  Other Instructions   Your cardiac CT will be scheduled at one of the below locations:   [facility] [address] [place], [place] [address] [phone]  If scheduled at [facility], please arrive at the [facility]'s Entrance (Entrance C2) of [facility] 30 minutes prior to test start time. You can use the FREE valet parking offered at entrance C (encouraged to control the heart rate for the test)  Proceed to the [facility] Radiology Department (first floor) to check-in and test prep.  All radiology patients and guests should use entrance C2 at [facility], accessed from [facility], even though the hospital's physical address listed is [address]    Please follow these instructions carefully (unless otherwise directed):  An IV will be required  for this test and Nitroglycerin will be given.   On the Night Before the Test: Be sure to Drink plenty of water. Do not consume any caffeinated/decaffeinated beverages or chocolate 12 hours prior to your test. Do not take any antihistamines 12 hours prior to your test.  On the Day of the  Test: Drink plenty of water until 1 hour prior to the test. Do not eat any food 1 hour prior to test. You may take your regular medications prior to the test.  Take metoprolol (Lopressor) two hours prior to test. FEMALES- please wear underwire-free bra if available, avoid dresses & tight clothing      After the Test: Drink plenty of water. After receiving IV contrast, you may experience a mild flushed feeling. This is normal. On occasion, you may experience a mild rash up to 24 hours after the test. This is not dangerous. If this occurs, you can take Benadryl 25 mg and increase your fluid intake. If you experience trouble breathing, this can be serious. If it is severe call 911 IMMEDIATELY. If it is mild, please call our office.  We will call to schedule your test 2-4 weeks out understanding that some insurance companies will need an authorization prior to the service being performed.   For more information and frequently asked questions, please visit our website : [email or web address]  For non-scheduling related questions, please contact the cardiac imaging nurse navigator should you have any questions/concerns: Cardiac Imaging Nurse Navigators Direct Office Dial: [phone]   For scheduling needs, including cancellations and rescheduling, please call [place], [phone]. ---------------------------------------------------------------------------------------    1st Floor: - Lobby - Registration  - Pharmacy  - Lab - Cafe  2nd Floor: - PV Lab - Diagnostic Testing (echo, CT, nuclear med)  3rd Floor: - Vacant  4th Floor: - TCTS (cardiothoracic surgery) - AFib Clinic - Structural Heart Clinic - Vascular Surgery  - Vascular Ultrasound  5th Floor: - HeartCare Cardiology (general and EP) - Clinical Pharmacy for coumadin, hypertension, lipid, weight-loss medications, and med management appointments    Valet parking services will be available as well.

## 2024-01-09 NOTE — Progress Notes (Signed)
 Patient ID: Veronica Griffin, female   DOB: 07-17-1991, 33 y.o.   MRN: [id number]        Chief Complaint: follow up cough, wheezing, low vit d       HPI:  Veronica Griffin is a 33 y.o. female Here with acute onset mild to mod 3 days ST, HA, general weakness and malaise, with prod cough greenish sputum, but Pt denies chest pain, orthopnea, PND, increased LE swelling, palpitations, dizziness or syncope but has mild wheezing sob doe in last 2 days.   Pt denies polydipsia, polyuria, or new focal neuro s/s.    Pt denies recent wt loss, night sweats, loss of appetite, or other constitutional symptoms       Wt Readings from Last 3 Encounters:  01/09/24 155 lb (70.3 kg)  01/09/24 159 lb (72.1 kg)  11/30/23 159 lb (72.1 kg)   BP Readings from Last 3 Encounters:  01/09/24 (!) 136/100  01/09/24 130/76  11/30/23 126/84         Past Medical History:  Diagnosis Date   Antepartum hemorrhage 10/08/2012   Had [place] in MAU at 8 weeks--large [facility]. Repeat US at 10 weeks at [facility]--viable IUP, [facility] noted.   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 She has never used smokeless tobacco. She reports current alcohol use. She reports that she does not use drugs. family history includes Hypertension in her father, maternal aunt,  and mother. No Known Allergies Current Outpatient Medications on File Prior to Visit  Medication Sig Dispense Refill   amphetamine-dextroamphetamine (ADDERALL) 20 MG tablet Take 1/2-1 tablet (10-20 mg total) by mouth 2 (two) times daily. 60 tablet 0   amphetamine-dextroamphetamine (ADDERALL) 20 MG tablet Take 1/2-1 tablet (10-20 mg total) by mouth 2 (two) times daily. 60 tablet 0   [START ON 01/25/2024] amphetamine-dextroamphetamine (ADDERALL) 20 MG tablet Take 1/2-1 tablet (10-20 mg total) by mouth 2 (two) times daily. 60 tablet 0   clonazePAM (KLONOPIN) 1 MG tablet Take 2 tablets (2 mg total) by mouth as needed for anxiety (seizure). (Patient not taking: Reported on 11/30/2023) 5 tablet 0   fluorometholone (FML) 0.1 % ophthalmic suspension Place 1 drop into both eyes 4 (four) times daily as directed 10 mL 3   metoprolol tartrate (LOPRESSOR) 100 MG tablet Take 1 tablet (100 mg total) by mouth once for 1 dose. Take 90-120 minutes prior to scan. Hold for SBP less than 110. 1 tablet 0   telmisartan (MICARDIS) 40 MG tablet Take 1 tablet (40 mg total) by mouth daily. 30 tablet 3   triamcinolone cream (KENALOG) 0.1 % Apply 1 Application topically 2 (two) times daily. Use for at least 1 week on area of itching then stop to see if this resolves. May repeat as needed. 80  g 0   trimethoprim-polymyxin b (POLYTRIM) ophthalmic solution Place 1 drop into both eyes 3 (three) times daily as directed. (Patient not taking: Reported on 11/30/2023) 10 mL 1   valACYclovir (VALTREX) 1000 MG tablet Take 1 tablet (1,000 mg total) by mouth daily. 90 tablet 3   No current facility-administered medications on file prior to visit.        ROS:  All others reviewed and negative.  Objective        PE:  BP 130/76 (BP Location: Left Arm, Patient Position: Sitting, Cuff Size: Normal)   Pulse 87   Temp 98.7 F (37.1 C) (Oral)   Ht 5\' 6"  (1.676 m)   Wt 159 lb (72.1 kg)   LMP 12/17/2023 (Approximate)   SpO2 99%   BMI 25.66  kg/m                 Constitutional: Pt appears mild ill               HENT: Head: NCAT.                Right Ear: External ear normal.                 Left Ear: External ear normal. Bilat tm's with mild erythema.  Max sinus areas mild tender.  Pharynx with mild erythema, no exudate               Eyes: . Pupils are equal, round, and reactive to light. Conjunctivae and EOM are normal               Nose: without d/c or deformity               Neck: Neck supple. Gross normal ROM               Cardiovascular: Normal rate and regular rhythm.                 Pulmonary/Chest: Effort normal and breath sounds without rales but with mild bilateral wheezing.                             Neurological: Pt is alert. At baseline orientation, motor grossly intact               Skin: Skin is warm. No rashes, no other new lesions, LE edema - none               Psychiatric: Pt behavior is normal without agitation   Micro: none  Cardiac tracings I have personally interpreted today:  none  Pertinent Radiological findings (summarize): none   Lab Results  Component Value Date   WBC 3.9 11/30/2023   HGB 13.2 11/30/2023   HCT 40.1 11/30/2023   PLT 345 11/30/2023   GLUCOSE 103 (H) 11/30/2023   CHOL 145 11/30/2023   TRIG 38 11/30/2023   HDL 62 11/30/2023   LDLCALC 74 11/30/2023   ALT 13 11/30/2023   AST 11 11/30/2023   NA 139 11/30/2023   K 4.7 11/30/2023   CL 103 11/30/2023   CREATININE 0.89 11/30/2023   BUN 22 (H) 11/30/2023   CO2 21 11/30/2023   TSH 2.280 11/30/2023   INR 1.0 04/09/2023   HGBA1C 5.6 11/30/2023   Assessment/Plan:  Veronica Griffin is a 33 y.o. Asian [4] female with  has a past medical history of Antepartum hemorrhage (10/08/2012), Cervical intraepithelial neoplasia grade 1 (09/15/2021), Chronic  hypertension in obstetric context (10/10/2012), Diastolic blood pressure 90 mm Hg or higher (02/15/2022), Eclampsia (03/07/2013), First-degree perineal laceration, with delivery  (03/12/2013), Gasping for breath (02/15/2022), Habitual snoring (10/13/2021), Hemoglobinopathy (HCC) (09/15/2021), Hypertension, Palpitations, Pre-eclampsia added to pre-existing hypertension (03/07/2013), and Vaginal delivery (03/12/2013).  Cough Mild to mod, c/w bronchitis vs pna, declines cxr, for antibx course zpack, cough med prn,  to f/u any worsening symptoms or concerns  Wheezing Mild to mod, for depomedrol 80 mg IM, prednisone taper,inhaler prn,, zyrtec 10 every day prn, ,  to f/u any worsening symptoms or concerns  Vitamin D deficiency Last vitamin D Lab Results  Component Value Date   VD25OH 21.6 (L) 11/28/2022   Low, to start oral replacement  Followup: Return if symptoms worsen or fail to improve.  Rosalia Colonel, MD 01/12/2024 6:45 PM [facility]

## 2024-01-12 ENCOUNTER — Encounter: Payer: Self-pay | Admitting: Internal Medicine

## 2024-01-12 DIAGNOSIS — R059 Cough, unspecified: Secondary | ICD-10-CM | POA: Insufficient documentation

## 2024-01-12 DIAGNOSIS — R062 Wheezing: Secondary | ICD-10-CM | POA: Insufficient documentation

## 2024-01-12 NOTE — Assessment & Plan Note (Signed)
 Last vitamin D Lab Results  Component Value Date   VD25OH 21.6 (L) 11/28/2022   Low, to start oral replacement

## 2024-01-12 NOTE — Assessment & Plan Note (Signed)
 Mild to mod, c/w  bronchitis vs pna, declines cxr,  for antibx course zpack, cough med prn,  to f/u any worsening symptoms or concerns

## 2024-01-12 NOTE — Assessment & Plan Note (Signed)
 Mild to mod, for depomedrol 80 mg IM, prednisone taper,inhaler prn,, zyrtec 10 every day prn, ,  to f/u any worsening symptoms or concerns

## 2024-01-16 ENCOUNTER — Telehealth: Payer: Self-pay | Admitting: Internal Medicine

## 2024-01-16 ENCOUNTER — Ambulatory Visit

## 2024-01-16 NOTE — Telephone Encounter (Signed)
 Pt called in to r/s nurse visit that was today. Please advise.

## 2024-01-16 NOTE — Telephone Encounter (Signed)
 Spoke with pt regarding her appointment. Pt was rescheduled for Friday 4/18 at 2pm. Pt verbalized understanding. All questions, if any, were answered.

## 2024-01-18 ENCOUNTER — Ambulatory Visit: Attending: Cardiology

## 2024-01-25 ENCOUNTER — Other Ambulatory Visit: Payer: Self-pay | Admitting: Family Medicine

## 2024-01-25 ENCOUNTER — Other Ambulatory Visit: Payer: Self-pay | Admitting: Internal Medicine

## 2024-01-25 ENCOUNTER — Other Ambulatory Visit: Payer: Self-pay | Admitting: Nurse Practitioner

## 2024-01-25 ENCOUNTER — Encounter (HOSPITAL_COMMUNITY): Payer: Self-pay

## 2024-01-25 ENCOUNTER — Other Ambulatory Visit

## 2024-01-25 DIAGNOSIS — I10 Essential (primary) hypertension: Secondary | ICD-10-CM

## 2024-01-25 DIAGNOSIS — R0609 Other forms of dyspnea: Secondary | ICD-10-CM

## 2024-01-25 LAB — COMPREHENSIVE METABOLIC PANEL WITH GFR
ALT: 13 U/L (ref 0–35)
AST: 12 U/L (ref 0–37)
Albumin: 4.8 g/dL (ref 3.5–5.2)
Alkaline Phosphatase: 36 U/L — ABNORMAL LOW (ref 39–117)
BUN: 17 mg/dL (ref 6–23)
CO2: 26 meq/L (ref 19–32)
Calcium: 9.1 mg/dL (ref 8.4–10.5)
Chloride: 102 meq/L (ref 96–112)
Creatinine, Ser: 0.82 mg/dL (ref 0.40–1.20)
GFR: 94.59 mL/min (ref >60.00)
Glucose, Bld: 96 mg/dL (ref 70–99)
Potassium: 3.6 meq/L (ref 3.5–5.1)
Sodium: 136 meq/L (ref 135–145)
Total Bilirubin: 0.7 mg/dL (ref 0.2–1.2)
Total Protein: 8 g/dL (ref 6.0–8.3)

## 2024-01-25 NOTE — Telephone Encounter (Signed)
 Received message from CT IMG Navigator that patient needs labs before cCTA on 01/29/24.  Left her VM to call back and also sent the information through her mychart portal.  Order placed and released.

## 2024-01-28 ENCOUNTER — Encounter: Payer: Self-pay | Admitting: Nurse Practitioner

## 2024-01-29 ENCOUNTER — Ambulatory Visit (HOSPITAL_COMMUNITY)
Admission: RE | Admit: 2024-01-29 | Discharge: 2024-01-29 | Disposition: A | Discharge status: Home / Self Care | Source: Ambulatory Visit | Attending: Internal Medicine | Admitting: Internal Medicine

## 2024-01-29 ENCOUNTER — Other Ambulatory Visit (HOSPITAL_COMMUNITY): Payer: Self-pay

## 2024-01-29 ENCOUNTER — Other Ambulatory Visit: Payer: Self-pay

## 2024-01-29 DIAGNOSIS — R0609 Other forms of dyspnea: Secondary | ICD-10-CM | POA: Insufficient documentation

## 2024-01-29 MED ORDER — IOHEXOL 350 MG/ML SOLN
100.0000 mL | Freq: Once | INTRAVENOUS | Status: AC | PRN
  Administered 2024-01-29: 100 mL via INTRAVENOUS

## 2024-01-29 MED ORDER — NITROGLYCERIN 0.4 MG SL SUBL
SUBLINGUAL_TABLET | SUBLINGUAL | Status: AC
Start: 2024-01-29 — End: ?
  Filled 2024-01-29: qty 2

## 2024-01-30 ENCOUNTER — Encounter: Payer: Self-pay | Admitting: Internal Medicine

## 2024-02-04 ENCOUNTER — Other Ambulatory Visit (HOSPITAL_COMMUNITY): Payer: Self-pay

## 2024-02-12 ENCOUNTER — Ambulatory Visit: Payer: Self-pay | Admitting: Internal Medicine

## 2024-02-13 ENCOUNTER — Ambulatory Visit (HOSPITAL_COMMUNITY): Attending: Cardiology

## 2024-02-13 DIAGNOSIS — I361 Nonrheumatic tricuspid (valve) insufficiency: Secondary | ICD-10-CM | POA: Diagnosis not present

## 2024-02-13 DIAGNOSIS — R0609 Other forms of dyspnea: Secondary | ICD-10-CM | POA: Insufficient documentation

## 2024-02-13 LAB — ECHOCARDIOGRAM COMPLETE
Area-P 1/2: 5.48 cm2
S' Lateral: 3.05 cm

## 2024-02-18 ENCOUNTER — Other Ambulatory Visit (HOSPITAL_COMMUNITY): Payer: Self-pay

## 2024-03-09 ENCOUNTER — Encounter: Payer: Self-pay | Admitting: Nurse Practitioner

## 2024-03-09 DIAGNOSIS — Z139 Encounter for screening, unspecified: Secondary | ICD-10-CM

## 2024-03-17 ENCOUNTER — Other Ambulatory Visit: Payer: Self-pay

## 2024-03-17 ENCOUNTER — Other Ambulatory Visit (HOSPITAL_COMMUNITY): Payer: Self-pay

## 2024-03-17 ENCOUNTER — Other Ambulatory Visit: Payer: Self-pay | Admitting: Nurse Practitioner

## 2024-03-17 DIAGNOSIS — F988 Other specified behavioral and emotional disorders with onset usually occurring in childhood and adolescence: Secondary | ICD-10-CM

## 2024-03-17 MED ORDER — AMPHETAMINE-DEXTROAMPHETAMINE 20 MG PO TABS
10.0000 mg | ORAL_TABLET | Freq: Two times a day (BID) | ORAL | 0 refills | Status: DC
  Filled 2024-03-17: qty 60, 30d supply, fill #0

## 2024-03-17 NOTE — Telephone Encounter (Signed)
 Last apt 11/30/23.

## 2024-04-14 ENCOUNTER — Encounter: Payer: Self-pay | Admitting: Nurse Practitioner

## 2024-05-06 ENCOUNTER — Ambulatory Visit (INDEPENDENT_AMBULATORY_CARE_PROVIDER_SITE_OTHER): Admitting: Family Medicine

## 2024-05-06 ENCOUNTER — Ambulatory Visit: Payer: Self-pay | Admitting: Family Medicine

## 2024-05-06 ENCOUNTER — Encounter: Payer: Self-pay | Admitting: Family Medicine

## 2024-05-06 VITALS — BP 126/86 | HR 100 | Temp 97.6°F | Ht 66.0 in

## 2024-05-06 DIAGNOSIS — R5383 Other fatigue: Secondary | ICD-10-CM

## 2024-05-06 DIAGNOSIS — Z139 Encounter for screening, unspecified: Secondary | ICD-10-CM

## 2024-05-06 DIAGNOSIS — R110 Nausea: Secondary | ICD-10-CM

## 2024-05-06 DIAGNOSIS — Z3201 Encounter for pregnancy test, result positive: Secondary | ICD-10-CM | POA: Diagnosis not present

## 2024-05-06 LAB — CBC WITH DIFFERENTIAL/PLATELET
Basophils Absolute: 0 K/uL (ref 0.0–0.1)
Basophils Relative: 0.7 % (ref 0.0–3.0)
Eosinophils Absolute: 0 K/uL (ref 0.0–0.7)
Eosinophils Relative: 0.6 % (ref 0.0–5.0)
HCT: 38.2 % (ref 36.0–46.0)
Hemoglobin: 12.5 g/dL (ref 12.0–15.0)
Lymphocytes Relative: 36.7 % (ref 12.0–46.0)
Lymphs Abs: 1.6 K/uL (ref 0.7–4.0)
MCHC: 32.7 g/dL (ref 30.0–36.0)
MCV: 75.2 fl — ABNORMAL LOW (ref 78.0–100.0)
Monocytes Absolute: 0.3 K/uL (ref 0.1–1.0)
Monocytes Relative: 7.9 % (ref 3.0–12.0)
Neutro Abs: 2.4 K/uL (ref 1.4–7.7)
Neutrophils Relative %: 54.1 % (ref 43.0–77.0)
Platelets: 373 K/uL (ref 150.0–400.0)
RBC: 5.09 Mil/uL (ref 3.87–5.11)
RDW: 14 % (ref 11.5–15.5)
WBC: 4.3 K/uL (ref 4.0–10.5)

## 2024-05-06 LAB — COMPREHENSIVE METABOLIC PANEL WITH GFR
ALT: 9 U/L (ref 0–35)
AST: 10 U/L (ref 0–37)
Albumin: 4.7 g/dL (ref 3.5–5.2)
Alkaline Phosphatase: 30 U/L — ABNORMAL LOW (ref 39–117)
BUN: 14 mg/dL (ref 6–23)
CO2: 25 meq/L (ref 19–32)
Calcium: 9 mg/dL (ref 8.4–10.5)
Chloride: 103 meq/L (ref 96–112)
Creatinine, Ser: 0.73 mg/dL (ref 0.40–1.20)
GFR: 108.54 mL/min (ref >60.00)
Glucose, Bld: 104 mg/dL — ABNORMAL HIGH (ref 70–99)
Potassium: 3.9 meq/L (ref 3.5–5.1)
Sodium: 136 meq/L (ref 135–145)
Total Bilirubin: 0.7 mg/dL (ref 0.2–1.2)
Total Protein: 7.7 g/dL (ref 6.0–8.3)

## 2024-05-06 LAB — TSH: TSH: 3.25 u[IU]/mL (ref 0.35–5.50)

## 2024-05-06 LAB — HCG, QUANTITATIVE, PREGNANCY: Quantitative HCG: <0.6 m[IU]/mL

## 2024-05-06 NOTE — Progress Notes (Signed)
 Subjective:     Patient ID: Veronica Griffin, female    DOB: Nov 26, 1990, 33 y.o.   MRN: [id number]  Chief Complaint  Patient presents with   lab test    Would like hcg blood test as last week she was nauseous and so took multiple pregnancy tests over the weekend. 2 boxes of tests were positive and other brand was showing negatives    HPI  Discussed the use of AI scribe software for clinical note transcription with the patient, who gave verbal consent to proceed.  History of Present Illness  Here with concerns regarding positive urine pregnancy tests at home and then negative tests as well.   LMP: 05/01/2024  Normal period  Having unprotected sex.   States she had nausea and fatigue last week. She took a pregnancy test, she had 3 positive results.  She then also had some negative urine pregnancy tests.  Urine pregnancy yesterday was negative.   No fever, chills, body aches, headache, abdominal pain, vomiting, diarrhea, or constipation.   Dizzy with standing   Weight consistent since April     Health Maintenance Due  Topic Date Due   Pneumococcal Vaccine: 19-49 Years (1 of 2 - PCV) Never done   Hepatitis B Vaccines (1 of 3 - 19+ 3-dose series) Never done   HPV VACCINES (1 - 3-dose SCDM series) Never done   COVID-19 Vaccine (1 - 2024-25 season) Never done   INFLUENZA VACCINE  05/02/2024    Past Medical History:  Diagnosis Date   Antepartum hemorrhage 10/08/2012   Had US  in MAU at 8 weeks--large [facility]. Repeat US  at 10 weeks at [facility]--viable IUP, [facility] noted.   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   Hypertension    Taking Aldomet  250mg  TID   Palpitations    Pre-eclampsia added to pre-existing hypertension 03/07/2013   Vaginal delivery 03/12/2013    Past Surgical History:  Procedure Laterality Date   NO PAST SURGERIES      Family History  Problem Relation Age of Onset   Hypertension Mother    Hypertension Father    Hypertension Maternal Aunt     Social History   Socioeconomic History   Marital status: Significant Other    Spouse name: Not on file   Number of children: 1   Years of education: 13.5   Highest education level: Not on file  Occupational History   Occupation: Administrator: [employer]  Tobacco Use   Smoking status: Former   Smokeless tobacco: Never  Advertising account planner   Vaping status: Every Day  Substance and Sexual Activity   Alcohol use: Yes    Comment: occ   Drug use: No   Sexual activity: Not on file  Other Topics Concern   Not on file  Social History Narrative   Lives with boyfriend and daughter   R handed   Caffeine: 2-3 cans of energy drinks in a week.    Social Drivers of Health   Financial Resource Strain: Low Risk  (11/30/2023)  Overall Financial Resource Strain (CARDIA)    Difficulty of Paying Living Expenses: Not very hard  Food Insecurity: No Food Insecurity (11/30/2023)   Hunger Vital Sign    Worried About Running Out of Food in the Last Year: Never true    Ran Out of Food in the Last Year: Never true  Transportation Needs: No Transportation Needs (11/30/2023)   PRAPARE - Administrator, Civil Service (Medical): No    Lack of Transportation (Non-Medical): No  Physical Activity: Sufficiently Active (11/30/2023)   Exercise Vital Sign    Days of Exercise per Week: 6 days    Minutes of Exercise per Session: 150+ min  Stress: No Stress Concern Present (11/30/2023)   [employer] of Occupational Health - Occupational Stress Questionnaire     Feeling of Stress : Only a little  Social Connections: Socially Integrated (11/30/2023)   Social Connection and Isolation Panel    Frequency of Communication with Friends and Family: More than three times a week    Frequency of Social Gatherings with Friends and Family: Once a week    Attends Religious Services: 1 to 4 times per year    Active Member of [employer] or Organizations: Yes    Attends Banker Meetings: 1 to 4 times per year    Marital Status: Living with partner  Intimate Partner Violence: Not At Risk (11/30/2023)   Humiliation, Afraid, Rape, and Kick questionnaire    Fear of Current or Ex-Partner: No    Emotionally Abused: No    Physically Abused: No    Sexually Abused: No    Outpatient Medications Prior to Visit  Medication Sig Dispense Refill   amphetamine -dextroamphetamine  (ADDERALL) 20 MG tablet Take 1/2-1 tablet (10-20 mg total) by mouth 2 (two) times daily. 60 tablet 0   amphetamine -dextroamphetamine  (ADDERALL) 20 MG tablet Take 1/2-1 tablet (10-20 mg total) by mouth 2 (two) times daily. 60 tablet 0   amphetamine -dextroamphetamine  (ADDERALL) 20 MG tablet Take 1/2-1 tablet (10-20 mg total) by mouth 2 (two) times daily. 60 tablet 0   cetirizine  (ZYRTEC ) 10 MG tablet Take 1 tablet (10 mg total) by mouth daily. 30 tablet 11   telmisartan  (MICARDIS ) 40 MG tablet Take 1 tablet (40 mg total) by mouth daily. 30 tablet 3   Albuterol -Budesonide  (AIRSUPRA ) 90-80 MCG/ACT AERO Inhale 2 Inhalations into the lungs 4 (four) times daily as needed. (Patient not taking: Reported on 05/06/2024) 10.7 g 3   triamcinolone  cream (KENALOG ) 0.1 % Apply 1 Application topically 2 (two) times daily. Use for at least 1 week on area of itching then stop to see if this resolves. May repeat as needed. (Patient not taking: Reported on 05/06/2024) 80 g 0   valACYclovir  (VALTREX ) 1000 MG tablet Take 1 tablet (1,000 mg total) by mouth daily. (Patient not taking: Reported on 05/06/2024) 90 tablet 3    clonazePAM  (KLONOPIN ) 1 MG tablet Take 2 tablets (2 mg total) by mouth as needed for anxiety (seizure). (Patient not taking: Reported on 05/06/2024) 5 tablet 0   fluorometholone  (FML) 0.1 % ophthalmic suspension Place 1 drop into both eyes 4 (four) times daily as directed 10 mL 3   metoprolol  tartrate (LOPRESSOR ) 100 MG tablet Take 1 tablet (100 mg total) by mouth once for 1 dose. Take 90-120 minutes prior to scan. Hold for SBP less than 110. 1 tablet 0   trimethoprim -polymyxin b  (POLYTRIM ) ophthalmic solution Place 1 drop into both eyes 3 (three) times daily as directed. (Patient not taking: Reported  on 11/30/2023) 10 mL 1   No facility-administered medications prior to visit.    No Known Allergies  ROS Per HPI    Objective:    Physical Exam Constitutional:      General: She is not in acute distress.    Appearance: She is not ill-appearing.  HENT:     Mouth/Throat:     Mouth: Mucous membranes are moist.     Pharynx: Oropharynx is clear.  Eyes:     Extraocular Movements: Extraocular movements intact.     Conjunctiva/sclera: Conjunctivae normal.  Cardiovascular:     Rate and Rhythm: Normal rate and regular rhythm.  Pulmonary:     Effort: Pulmonary effort is normal.     Breath sounds: Normal breath sounds.  Abdominal:     General: Bowel sounds are normal. There is no distension.     Palpations: Abdomen is soft.     Tenderness: There is no abdominal tenderness. There is no guarding or rebound.  Musculoskeletal:     Cervical back: Normal range of motion and neck supple.  Skin:    General: Skin is warm and dry.  Neurological:     General: No focal deficit present.     Mental Status: She is alert and oriented to person, place, and time.     Cranial Nerves: No cranial nerve deficit.     Motor: No weakness.     Coordination: Coordination normal.     Gait: Gait normal.  Psychiatric:        Mood and Affect: Mood normal.        Behavior: Behavior normal.        Thought Content:  Thought content normal.      BP 126/86   Pulse 100   Temp 97.6 F (36.4 C) (Temporal)   Ht 5' 6 (1.676 m)   LMP 05/01/2024   SpO2 99%   BMI 25.02 kg/m  Wt Readings from Last 3 Encounters:  01/09/24 155 lb (70.3 kg)  01/09/24 159 lb (72.1 kg)  11/30/23 159 lb (72.1 kg)       Assessment & Plan:   Problem List Items Addressed This Visit   None Visit Diagnoses       Positive urine pregnancy test    -  Primary   Relevant Orders   hCG, quantitative, pregnancy     Fatigue, unspecified type       Relevant Orders   CBC with Differential/Platelet   Comprehensive metabolic panel with GFR   TSH     Nausea         HCT quantitative ordered.  Check labs to look for underlying etiology for fatigue.  Nausea has improved.   Exam is benign Follow-up pending results.   I have discontinued Nyasiah Tollett's clonazePAM , trimethoprim -polymyxin b , fluorometholone , and metoprolol  tartrate. I am also having her maintain her valACYclovir , triamcinolone  cream, amphetamine -dextroamphetamine , amphetamine -dextroamphetamine , cetirizine , Airsupra , telmisartan , and amphetamine -dextroamphetamine .  No orders of the defined types were placed in this encounter.

## 2024-05-15 ENCOUNTER — Other Ambulatory Visit

## 2024-05-15 ENCOUNTER — Other Ambulatory Visit (INDEPENDENT_AMBULATORY_CARE_PROVIDER_SITE_OTHER)

## 2024-05-15 ENCOUNTER — Other Ambulatory Visit: Payer: Self-pay

## 2024-05-15 DIAGNOSIS — Z111 Encounter for screening for respiratory tuberculosis: Secondary | ICD-10-CM

## 2024-05-18 LAB — QUANTIFERON-TB GOLD PLUS
Mitogen-NIL: >10 [IU]/mL
NIL: 0.13 [IU]/mL
QuantiFERON-TB Gold Plus: NEGATIVE
TB1-NIL: 0.04 [IU]/mL
TB2-NIL: 0.03 [IU]/mL

## 2024-05-19 ENCOUNTER — Encounter: Payer: Self-pay | Admitting: Nurse Practitioner

## 2024-06-02 ENCOUNTER — Other Ambulatory Visit: Payer: Self-pay | Admitting: Internal Medicine

## 2024-06-02 DIAGNOSIS — I10 Essential (primary) hypertension: Secondary | ICD-10-CM

## 2024-06-04 ENCOUNTER — Other Ambulatory Visit (HOSPITAL_COMMUNITY): Payer: Self-pay

## 2024-06-04 MED ORDER — TELMISARTAN 40 MG PO TABS
40.0000 mg | ORAL_TABLET | Freq: Every day | ORAL | 7 refills | Status: AC
  Filled 2024-06-04: qty 90, 90d supply, fill #0
  Filled 2024-08-18: qty 90, 90d supply, fill #1
  Filled 2024-08-21: qty 30, 30d supply, fill #1
  Filled 2024-11-05: qty 30, 30d supply, fill #2

## 2024-06-19 ENCOUNTER — Other Ambulatory Visit: Payer: Self-pay | Admitting: Nurse Practitioner

## 2024-06-19 DIAGNOSIS — F988 Other specified behavioral and emotional disorders with onset usually occurring in childhood and adolescence: Secondary | ICD-10-CM

## 2024-06-19 NOTE — Telephone Encounter (Signed)
 Last apt 11/30/23 next apt 12/08/24.

## 2024-06-21 ENCOUNTER — Other Ambulatory Visit (HOSPITAL_COMMUNITY): Payer: Self-pay

## 2024-06-21 MED ORDER — AMPHETAMINE-DEXTROAMPHETAMINE 20 MG PO TABS
10.0000 mg | ORAL_TABLET | Freq: Two times a day (BID) | ORAL | 0 refills | Status: DC
  Filled 2024-06-21: qty 60, 30d supply, fill #0

## 2024-06-30 ENCOUNTER — Encounter: Payer: Self-pay | Admitting: Nurse Practitioner

## 2024-08-18 ENCOUNTER — Other Ambulatory Visit (HOSPITAL_COMMUNITY): Payer: Self-pay

## 2024-08-18 ENCOUNTER — Encounter (HOSPITAL_COMMUNITY): Payer: Self-pay

## 2024-08-18 ENCOUNTER — Other Ambulatory Visit: Payer: Self-pay

## 2024-08-18 ENCOUNTER — Other Ambulatory Visit: Payer: Self-pay | Admitting: Family Medicine

## 2024-08-18 DIAGNOSIS — F988 Other specified behavioral and emotional disorders with onset usually occurring in childhood and adolescence: Secondary | ICD-10-CM

## 2024-08-18 MED ORDER — AMPHETAMINE-DEXTROAMPHETAMINE 20 MG PO TABS
10.0000 mg | ORAL_TABLET | Freq: Two times a day (BID) | ORAL | 0 refills | Status: DC
  Filled 2024-08-18: qty 60, 30d supply, fill #0

## 2024-08-19 ENCOUNTER — Other Ambulatory Visit (HOSPITAL_COMMUNITY): Payer: Self-pay

## 2024-08-21 ENCOUNTER — Ambulatory Visit: Payer: Self-pay | Attending: Internal Medicine | Admitting: Internal Medicine

## 2024-08-21 ENCOUNTER — Encounter: Payer: Self-pay | Admitting: Internal Medicine

## 2024-08-21 ENCOUNTER — Other Ambulatory Visit (HOSPITAL_COMMUNITY): Payer: Self-pay

## 2024-08-21 VITALS — BP 130/85 | HR 101 | Ht 66.0 in | Wt 155.4 lb

## 2024-08-21 DIAGNOSIS — I10 Essential (primary) hypertension: Secondary | ICD-10-CM

## 2024-08-21 DIAGNOSIS — R0609 Other forms of dyspnea: Secondary | ICD-10-CM

## 2024-08-21 DIAGNOSIS — N182 Chronic kidney disease, stage 2 (mild): Secondary | ICD-10-CM

## 2024-08-21 DIAGNOSIS — Z8249 Family history of ischemic heart disease and other diseases of the circulatory system: Secondary | ICD-10-CM

## 2024-08-21 NOTE — Progress Notes (Signed)
 Cardiology Office Note:   Date:  08/21/2024  ID:  Veronica Griffin, DOB January 28, 1991, MRN [id number] PCP:  Oris Camie BRAVO, NP  [facility] Providers Cardiologist:  Wendel Haws, MD Referring MD: Oris Camie BRAVO, NP  Chief Complaint/Reason for Referral: Family history of cardiomyopathy ASSESSMENT:    1. Family history of cardiomyopathy   2. Dyspnea on exertion   3. Primary hypertension   4. CKD (chronic kidney disease) stage 2, GFR 60-89 ml/min      PLAN:   In order of problems listed above: Family history of cardiomyopathy: Echocardiogram demonstrates normal LV function.  No further evaluation is required.   Dyspnea: Evaluation has been reassuring with normal coronary CTA and echocardiogram. Hypertension: BP mildly elevated today.  Continue Micardis  40 mg. CKD stage II: Continue Micardis  40 mg            Dispo:  Return if symptoms worsen or fail to improve.      Medication Adjustments/Labs and Tests Ordered: Current medicines are reviewed at length with the patient today.  Concerns regarding medicines are outlined above.  The following changes have been made:     Labs/tests ordered: No orders of the defined types were placed in this encounter.   Medication Changes: No orders of the defined types were placed in this encounter.   Current medicines are reviewed at length with the patient today.  The patient does not have concerns regarding medicines.    History of Present Illness:      FOCUSED PROBLEM LIST:   Dyspnea CAC 0, no coronary disease coronary CTA 2025 No diastolic dysfunction, no significant valve issues, EF 65 to 70% TTE May 2025 Venous anomaly Broca's area >> seizure disorder Followed by neurology CKD stage II Family history of peripartum cardiomyopathy HTN Amlodipine , Atenolol  >> drowsiness History of preeclampsia Has been on chronic labetalol  since pregnancy 2015 BMI 25 January 2024:  Patient consents to use of AI scribe. The patient is  a 33 year old female with above listed medical problems referred by her PCP due to family history of cardiomyopathy.  The patient has a long history of seizure disorder.  She is followed by neurology.  She was seen by her PCP recently she was doing well without cardiovascular complaints.  Her blood pressure is well-controlled.  Due to her family history she is referred for further recommendations.  She experiences increased shortness of breath and fatigue, which have recently worsened. Previously active, she now finds exercise straining. She also notes swelling in her legs and fingers. Despite a 20-pound weight loss over the past year, attributed to Adderall use, her shortness of breath persists.  She has a history of hypertension since high school, with previous readings as high as 145/100. Currently on Labetalol , prescribed during a period when she was trying to get pregnant, her blood pressure was previously as high as 180/100 but has improved to around 135/100. She experienced drowsiness with previous medications such as amlodipine  and atenolol .  She experiences occasional palpitations described as 'flutters' occurring once or twice a month. No chest discomfort during exercise, but she reports lightheadedness when squatting and standing up. No episodes of blacking out, bleeding, or significant chest pain.  Her sister has a history of heart failure that was noticed around pregnancy, raising concerns about familial cardiac issues.  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 She vapes and is trying to quit.  Plan:  Obtain echocardiogram, coronary CTA, start telmisartan  40 mg and check BMP.  November 2025:  Patient consents to use of AI scribe. The patient's coronary CTA and echocardiogram showed no significant abnormalities.  This week has been particularly challenging for her due to a death in  the family, which has affected her sleep and may have contributed to elevated blood pressure readings. She is currently taking her blood pressure medication as prescribed.  She is doing well and denies any shortness of breath, chest pain, palpitations, paroxysmal atrial dyspnea, orthopnea.        Current Medications: No outpatient medications have been marked as taking for the 08/21/24 encounter (Office Visit) with Dondrell Loudermilk K, MD.     Review of Systems:   Please see the history of present illness.    All other systems reviewed and are negative.     EKGs/Labs/Other Test Reviewed:   EKG: 2025 normal sinus rhythm non significant ST-T wave changes  EKG Interpretation Date/Time:    Ventricular Rate:    PR Interval:    QRS Duration:    QT Interval:    QTC Calculation:   R Axis:      Text Interpretation:           Risk Assessment/Calculations:          Physical Exam:   VS:  BP 130/85 (BP Location: Left Arm, Patient Position: Sitting, Cuff Size: Normal)   Pulse (!) 101   Ht 5' 6 (1.676 m)   Wt 155 lb 6.4 oz (70.5 kg)   SpO2 98%   BMI 25.08 kg/m        Wt Readings from Last 3 Encounters:  08/21/24 155 lb 6.4 oz (70.5 kg)  01/09/24 155 lb (70.3 kg)  01/09/24 159 lb (72.1 kg)      GENERAL:  No apparent distress, AOx3 HEENT:  No carotid bruits, +2 carotid impulses, no scleral icterus CAR: RRR no murmurs, gallops, rubs, or thrills RES:  Clear to auscultation bilaterally ABD:  Soft, nontender, nondistended, positive bowel sounds x 4 VASC:  +2 radial pulses, +2 carotid pulses NEURO:  CN 2-12 grossly intact; motor and sensory grossly intact PSYCH:  No active depression or anxiety EXT:  No edema, ecchymosis, or cyanosis  Signed, Bunyan Brier K Almus Woodham, MD  08/21/2024 4:44 PM    [facility] [address] [place], [place], [place]  [address] Phone: [phone]; Fax: [phone]   Note:  This document was prepared using Dragon voice recognition  software and may include unintentional dictation errors.

## 2024-08-26 ENCOUNTER — Ambulatory Visit: Payer: Self-pay | Admitting: Family Medicine

## 2024-08-26 ENCOUNTER — Encounter: Payer: Self-pay | Admitting: Family Medicine

## 2024-08-26 VITALS — BP 136/84 | HR 86 | Temp 98.3°F | Ht 66.0 in | Wt 157.0 lb

## 2024-08-26 DIAGNOSIS — J029 Acute pharyngitis, unspecified: Secondary | ICD-10-CM

## 2024-08-26 DIAGNOSIS — R52 Pain, unspecified: Secondary | ICD-10-CM

## 2024-08-26 DIAGNOSIS — R6883 Chills (without fever): Secondary | ICD-10-CM

## 2024-08-26 DIAGNOSIS — B349 Viral infection, unspecified: Secondary | ICD-10-CM

## 2024-08-26 LAB — POCT INFLUENZA A/B
Influenza A, POC: NEGATIVE
Influenza B, POC: NEGATIVE

## 2024-08-26 LAB — POCT RAPID STREP A (OFFICE): Rapid Strep A Screen: NEGATIVE

## 2024-08-26 LAB — POC COVID19 BINAXNOW: SARS Coronavirus 2 Ag: NEGATIVE

## 2024-08-26 NOTE — Patient Instructions (Addendum)
 COVID, flu and strep negative today.   Continue with mucinex as needed.  Continue supportive care at home.   Follow-up with me for new or worsening symptoms.

## 2024-08-26 NOTE — Progress Notes (Signed)
 Acute Office Visit  Subjective:     Patient ID: Veronica Griffin, female    DOB: 25-Jun-1991, 33 y.o.   MRN: [id number]  Chief Complaint  Patient presents with   Sore Throat    Onset 08/23/2024 Started with chills and body aches     HPI  33 year old female presents for evaluation of sore throat, body aches, chills, fatigue for the last 3 days.  Reports that she did attend 2 funerals over the weekend, but was not aware of being around anyone that was sick. Has been using Mucinex with mild relief. Has been drinking plenty of fluids. Denies abdominal pain, nausea, vomiting, diarrhea, rash, other symptoms. Medical history as outlined below    ROS Per HPI      Objective:    BP 136/84 (BP Location: Left Arm, Patient Position: Sitting, Cuff Size: Normal)   Pulse 86   Temp 98.3 F (36.8 C) (Oral)   Ht 5' 6 (1.676 m)   Wt 157 lb (71.2 kg)   SpO2 97%   BMI 25.34 kg/m    Physical Exam Vitals and nursing note reviewed.  Constitutional:      General: She is not in acute distress.    Appearance: Normal appearance. She is normal weight. She is ill-appearing.  HENT:     Head: Normocephalic and atraumatic.     Right Ear: External ear normal.     Left Ear: External ear normal.     Nose: Congestion present.     Mouth/Throat:     Mouth: Mucous membranes are moist.     Pharynx: Posterior oropharyngeal erythema present.     Comments: Oropharyngeal cobblestoning   Eyes:     Extraocular Movements: Extraocular movements intact.     Pupils: Pupils are equal, round, and reactive to light.  Cardiovascular:     Rate and Rhythm: Normal rate and regular rhythm.     Pulses: Normal pulses.     Heart sounds: Normal heart sounds.  Pulmonary:     Effort: Pulmonary effort is normal. No respiratory distress.     Breath sounds: Normal breath sounds. No wheezing, rhonchi or rales.  Musculoskeletal:        General: Normal range of motion.     Cervical back: Normal range of motion.      Right lower leg: No edema.     Left lower leg: No edema.  Lymphadenopathy:     Cervical: Cervical adenopathy present.  Neurological:     General: No focal deficit present.     Mental Status: She is alert and oriented to person, place, and time.  Psychiatric:        Mood and Affect: Mood normal.        Thought Content: Thought content normal.     Results for orders placed or performed in visit on 08/26/24  POC COVID-19 BinaxNow  Result Value Ref Range   SARS Coronavirus 2 Ag Negative Negative  POCT Influenza A/B  Result Value Ref Range   Influenza A, POC Negative Negative   Influenza B, POC Negative Negative  POCT rapid strep A  Result Value Ref Range   Rapid Strep A Screen Negative Negative        Assessment & Plan:   Sore throat -     POC COVID-19 BinaxNow -     POCT Influenza A/B -     POCT rapid strep A  Viral illness -     POC COVID-19 BinaxNow -  POCT Influenza A/B -     POCT rapid strep A  Chills -     POC COVID-19 BinaxNow -     POCT Influenza A/B -     POCT rapid strep A  Body aches -     POC COVID-19 BinaxNow -     POCT Influenza A/B -     POCT rapid strep A   Continue supportive care at home.      Orders Placed This Encounter  Procedures   POC COVID-19 BinaxNow   POCT Influenza A/B   POCT rapid strep A     No orders of the defined types were placed in this encounter.   Return if symptoms worsen or fail to improve.  Corean LITTIE Ku, FNP

## 2024-08-27 ENCOUNTER — Ambulatory Visit: Payer: Self-pay | Admitting: Family Medicine

## 2024-08-27 NOTE — Progress Notes (Signed)
 Negative viral testing, discussed in OV

## 2024-08-28 ENCOUNTER — Encounter: Payer: Self-pay | Admitting: Family Medicine

## 2024-08-28 MED ORDER — AZITHROMYCIN 250 MG PO TABS: ORAL_TABLET | ORAL | 0 refills | Status: AC

## 2024-11-05 ENCOUNTER — Other Ambulatory Visit: Payer: Self-pay | Admitting: Nurse Practitioner

## 2024-11-05 ENCOUNTER — Other Ambulatory Visit (HOSPITAL_COMMUNITY): Payer: Self-pay

## 2024-11-05 DIAGNOSIS — F988 Other specified behavioral and emotional disorders with onset usually occurring in childhood and adolescence: Secondary | ICD-10-CM

## 2024-11-05 NOTE — Telephone Encounter (Signed)
 Last appt. 11/26/23 next appt. 3/26.

## 2024-11-06 ENCOUNTER — Other Ambulatory Visit (HOSPITAL_COMMUNITY): Payer: Self-pay

## 2024-11-06 MED ORDER — AMPHETAMINE-DEXTROAMPHETAMINE 20 MG PO TABS: 10.0000 mg | ORAL_TABLET | Freq: Two times a day (BID) | ORAL | 0 refills | Status: AC

## 2024-11-06 MED ORDER — AMPHETAMINE-DEXTROAMPHETAMINE 20 MG PO TABS
10.0000 mg | ORAL_TABLET | Freq: Two times a day (BID) | ORAL | 0 refills | Status: AC
  Filled 2024-11-06: qty 60, 30d supply, fill #0

## 2024-12-08 ENCOUNTER — Encounter: Payer: 59 | Admitting: Nurse Practitioner
